# Patient Record
Sex: Female | Born: 1975
Health system: Southern US, Community
[De-identification: ages and names within clinical notes are randomized; demographics above are authoritative.]

## PROBLEM LIST (undated history)

## (undated) DIAGNOSIS — I4719 Other supraventricular tachycardia: Secondary | ICD-10-CM

## (undated) DIAGNOSIS — F419 Anxiety disorder, unspecified: Secondary | ICD-10-CM

## (undated) DIAGNOSIS — I471 Supraventricular tachycardia: Secondary | ICD-10-CM

## (undated) DIAGNOSIS — R87629 Unspecified abnormal cytological findings in specimens from vagina: Secondary | ICD-10-CM

## (undated) DIAGNOSIS — I10 Essential (primary) hypertension: Secondary | ICD-10-CM

## (undated) HISTORY — DX: Anxiety disorder, unspecified: F41.9

## (undated) HISTORY — DX: Supraventricular tachycardia: I47.1

## (undated) HISTORY — PX: ABLATION: SHX5711

## (undated) HISTORY — PX: LEEP: SHX91

## (undated) HISTORY — DX: Unspecified abnormal cytological findings in specimens from vagina: R87.629

## (undated) HISTORY — DX: Other supraventricular tachycardia: I47.19

---

## 2009-03-30 LAB — HEPATITIS B SURFACE ANTIGEN: Hepatitis B Surface Antigen: NEGATIVE

## 2009-03-30 LAB — HEPATITIS C ANTIBODY: Hepatitis C Ab: NEGATIVE

## 2009-03-30 LAB — HEPATITIS B SURFACE AB-POST VC IMM ST: Hepatitis B Surf Ab Quant: 11.5

## 2009-03-30 LAB — SYPHILIS: RPR W/REFLEX TO RPR TITER AND TREPONEMAL ANTIBODIES, TRADITIONAL SCREENING AND DIAGNOSIS ALGORITHM: RPR Ser-Titr: NONREACTIVE

## 2013-01-15 LAB — VARICELLA ZOSTER ANTIBODY, IGG: Varicella zoster IgG: 1059

## 2013-07-21 ENCOUNTER — Ambulatory Visit (INDEPENDENT_AMBULATORY_CARE_PROVIDER_SITE_OTHER): Payer: 59 | Admitting: Physician Assistant

## 2013-07-21 ENCOUNTER — Encounter: Payer: Self-pay | Admitting: Physician Assistant

## 2013-07-21 VITALS — BP 140/77 | HR 113 | Ht 69.0 in | Wt 218.0 lb

## 2013-07-21 DIAGNOSIS — E6609 Other obesity due to excess calories: Secondary | ICD-10-CM | POA: Insufficient documentation

## 2013-07-21 DIAGNOSIS — F411 Generalized anxiety disorder: Secondary | ICD-10-CM | POA: Insufficient documentation

## 2013-07-21 DIAGNOSIS — F329 Major depressive disorder, single episode, unspecified: Secondary | ICD-10-CM | POA: Insufficient documentation

## 2013-07-21 DIAGNOSIS — Z0000 Encounter for general adult medical examination without abnormal findings: Secondary | ICD-10-CM

## 2013-07-21 DIAGNOSIS — I498 Other specified cardiac arrhythmias: Secondary | ICD-10-CM

## 2013-07-21 DIAGNOSIS — R635 Abnormal weight gain: Secondary | ICD-10-CM | POA: Insufficient documentation

## 2013-07-21 DIAGNOSIS — F3289 Other specified depressive episodes: Secondary | ICD-10-CM

## 2013-07-21 DIAGNOSIS — Z13220 Encounter for screening for lipoid disorders: Secondary | ICD-10-CM

## 2013-07-21 DIAGNOSIS — Z131 Encounter for screening for diabetes mellitus: Secondary | ICD-10-CM

## 2013-07-21 DIAGNOSIS — Z6831 Body mass index (BMI) 31.0-31.9, adult: Secondary | ICD-10-CM

## 2013-07-21 DIAGNOSIS — I471 Supraventricular tachycardia: Secondary | ICD-10-CM | POA: Insufficient documentation

## 2013-07-21 DIAGNOSIS — F32A Depression, unspecified: Secondary | ICD-10-CM | POA: Insufficient documentation

## 2013-07-21 DIAGNOSIS — E669 Obesity, unspecified: Secondary | ICD-10-CM

## 2013-07-21 MED ORDER — ALPRAZOLAM 0.25 MG PO TABS: 0.2500 mg | ORAL_TABLET | ORAL | Status: DC | PRN

## 2013-07-21 MED ORDER — ALPRAZOLAM 0.5 MG PO TABS: ORAL_TABLET | ORAL | Status: DC

## 2013-07-21 MED ORDER — LORCASERIN HCL 10 MG PO TABS: 1.0000 | ORAL_TABLET | Freq: Two times a day (BID) | ORAL | Status: DC

## 2013-07-21 MED ORDER — METOPROLOL TARTRATE 50 MG PO TABS: 50.0000 mg | ORAL_TABLET | ORAL | Status: DC | PRN

## 2013-07-21 NOTE — Progress Notes (Signed)
Subjective:    Patient ID: Stephanie Rush, female    DOB: 1975-11-20, 38 y.o.   MRN: [id number]  HPI Patient is a 38 year old female who presents to the clinic to establish care.  .. Active Ambulatory Problems    Diagnosis Date Noted  . Depression 07/21/2013  . Anxiety state, unspecified 07/21/2013  . 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 Obesity, unspecified 07/21/2013  . Abnormal weight gain 07/21/2013   Resolved Ambulatory Problems    Diagnosis Date Noted  . No Resolved Ambulatory Problems   No Additional Past Medical History   .Marland Kitchen. Family History  Problem Relation Age of Onset  . Hypertension Father   . Hyperlipidemia Father   . Hypertension Brother   . Hyperlipidemia Brother   . Heart attack Maternal Grandmother   . Hyperlipidemia Maternal Grandmother   . Multiple sclerosis Maternal Grandmother   . Fibromyalgia Maternal Grandmother   . Alcohol abuse Paternal Grandmother   . Heart attack Paternal Grandfather   . Hypertension Paternal Grandfather   . Hyperlipidemia Paternal Grandfather   . Stroke Paternal Grandfather    .Marland Kitchen. History   Social History  . Marital Status: Married    Spouse Name: N/A    Number of Children: N/A  . Years of Education: N/A   Occupational History  . Not on file.   Social History Main Topics  . Smoking status: Former Games developermoker  . Smokeless tobacco: Not on file  . Alcohol Use: Yes  . Drug Use: No  . Sexual Activity: Yes   Other Topics Concern  . Not on file   Social History Narrative  . No narrative on file   Depression and anxiety-patient is currently controlled without any daily depression medication. About 2 months ago she weaned off Lexapro and feels great. She occasionally has to use Xanax for anxiety. She reports using this couple times a month. She would like a refill.  Reentry tachycardia-she has had 2 ablations. She still remains on metoprolol 50 mg daily. She does need a refill today. She has been released by  cardiology. [facility]  Obesity/weight gain-patient is really concerned about her weight. She has tried typical diet and exercising. She would like something to help her lose weight.     Review of Systems  All other systems reviewed and are negative.      Objective:   Physical Exam  Constitutional: She is oriented to person, place, and time. She appears well-developed and well-nourished.  Obese  HENT:  Head: Normocephalic and atraumatic.  Cardiovascular: Regular rhythm and normal heart sounds.   113 tachycardia  Pulmonary/Chest: Effort normal and breath sounds normal. She has no wheezes.  Neurological: She is alert and oriented to person, place, and time.  Skin: Skin is dry.  Psychiatric: She has a normal mood and affect. Her behavior is normal.          Assessment & Plan:  Depression/anxiety-followup as needed for maintenance. Xanax refill to use as needed. Discussed addictive nature of Xanax. And discourage use daily.  Reentry tachycardia-refilled metoprolol today. Patient is currently using as needed or palpitations and tachycardia. Her pulse rate today was 113. I really felt like she should start using daily. Suggested to patient daily use of metoprolol. I think this would help make her feel better and decrease episodes of palpation.  Obesity/abnormal weight gain-discuss options for medication intervention. She is not a candidate for phentermine to 2 her cardiac condition. I feel it pelvic  would be a great idea. I did give her the sample packs for 15 days to try. We can send prescription if she likes but weak and feels like it is helping her. Explained to patient this would not be a short-term weight loss medication but rather long-term and lifestyle change weight loss medication. Discussed other ways to cut calories and increase exercise. Patient is on board. Will followup in one month. We'll check thyroid, B12 and vitamin  D.  Screening labs for cholesterol and glucose were ordered today.  Patient is aware that she needs to schedule a complete physical

## 2013-07-21 NOTE — Patient Instructions (Signed)
Lorcaserin oral tablets  What is this medicine?  LORCASERIN (lor ca SER in) is used to promote and maintain weight loss in obese patients. This medicine should be used with a reduced calorie diet and, if appropriate, an exercise program.  This medicine may be used for other purposes; ask your health care Kamen Hanken or pharmacist if you have questions.  COMMON BRAND NAME(S): Belviq  What should I tell my health care Stephanie Rush before I take this medicine?  They need to know if you have any of these conditions:  -anatomical deformation of the penis, Peyronie's disease, or history of priapism (painful and prolonged erection)  -diabetes  -heart disease  -history of blood diseases, like sickle cell anemia or leukemia  -history of irregular heartbeat  -kidney disease  -liver disease  -suicidal thoughts, plans, or attempt; a previous suicide attempt by you or a family member  -an unusual or allergic reaction to lorcaserin, other medicines, foods, dyes, or preservatives  -pregnant or trying to get pregnant  -breast-feeding  How should I use this medicine?  Take this medicine by mouth with a glass of water. Follow the directions on the prescription label. You can take it with or without food. Take your medicine at regular intervals. Do not take it more often than directed. Do not stop taking except on your doctor's advice.  Talk to your pediatrician regarding the use of this medicine in children. Special care may be needed.  Overdosage: If you think you've taken too much of this medicine contact a poison control center or emergency room at once.  Overdosage: If you think you have taken too much of this medicine contact a poison control center or emergency room at once.  NOTE: This medicine is only for you. Do not share this medicine with others.  What if I miss a dose?  If you miss a dose, take it as soon as you can. If it is almost time for your next dose, take only that dose. Do not take double or extra doses.  What may  interact with this medicine?  -cabergoline  -certain medicines for depression, anxiety, or psychotic disturbances  -certain medicines for erectile dysfunction  -certain medicines for migraine headache like almotriptan, eletriptan, frovatriptan, naratriptan, rizatriptan, sumatriptan, zolmitriptan  -dextromethorphan  -linezolid  -MAOIs like Carbex, Eldepryl, Marplan, Nardil, and Parnate  -medicines for diabetes  -orlistat  -tramadol  -St. John's Wort  -stimulant medicines for attention disorders, weight loss, or to stay awake  This list may not describe all possible interactions. Give your health care Malorie Bigford a list of all the medicines, herbs, non-prescription drugs, or dietary supplements you use. Also tell them if you smoke, drink alcohol, or use illegal drugs. Some items may interact with your medicine.  What should I watch for while using this medicine?  This medicine is intended to be used in addition to a healthy diet and appropriate exercise. The best results are achieved this way. Do not increase or in any way change your dose without consulting your doctor or health care professional. Your doctor should tell you to stop taking this medicine if you do not lose a certain amount of weight within the first 12 weeks of treatment.  Visit your doctor or health care professional for regular checkups. Your doctor may order blood tests or other tests to see how you are doing.  Do not drive, use machinery, or do anything that needs mental alertness until you know how this medicine affects you.  This medicine   may affect blood sugar levels. If you have diabetes, check with your doctor or health care professional before you change your diet or the dose of your diabetic medicine.  Patients and their families should watch out for worsening depression or thoughts of suicide. Also watch out for sudden changes in feelings such as feeling anxious, agitated, panicky, irritable, hostile, aggressive, impulsive, severely restless,  overly excited and hyperactive, or not being able to sleep. If this happens, especially at the beginning of treatment or after a change in dose, call your health care professional.  Contact you doctor or health care professional right away if the erection lasts longer than 4 hours or if it becomes painful. This may be a sign of serious problem and must be treated right away to prevent permanent damage.  What side effects may I notice from receiving this medicine?  Side effects that you should report to your doctor or health care professional as soon as possible:  -allergic reactions like skin rash, itching or hives, swelling of the face, lips, or tongue  -abnormal production of milk  -breast enlargement in both males and females  -breathing problems  -changes in emotions or moods  -changes in vision  -confusion  -erection lasting more than 4 hours  -fast or irregular heart beat  -feeling faint or lightheaded, falls  -fever or chills, sore throat  -hallucination, loss of contact with reality  -high or low blood pressure  -menstrual changes  -restlessness  -seizures  -slow or irregular heartbeat  -stiff muscles  -sweating  -suicidal thoughts or other mood changes  -tremors  -trouble walking  -unusually weak or tired  -vomiting   Side effects that usually do not require medical attention (Report these to your doctor or health care professional if they continue or are bothersome.):  -back pain  -constipation  -cough  -dizziness  -dry mouth  -low blood sugar if you have diabetes (ask your doctor or healthcare professional for a list of these symptoms)  -nausea  -tiredness  This list may not describe all possible side effects. Call your doctor for medical advice about side effects. You may report side effects to FDA at 1-800-FDA-1088.  Where should I keep my medicine?  Keep out of the reach of children.  Store at room temperature between 15 and 30 degrees C (59 and 86 degrees F). Throw away any unused medicine after the  expiration date.  NOTE: This sheet is a summary. It may not cover all possible information. If you have questions about this medicine, talk to your doctor, pharmacist, or health care Amaziah Raisanen.  © 2014, [employer]. (2010-08-07 17:15:17)

## 2013-07-29 ENCOUNTER — Encounter: Payer: Self-pay | Admitting: Physician Assistant

## 2013-08-04 LAB — COMPLETE METABOLIC PANEL WITH GFR
ALBUMIN: 4.7 g/dL (ref 3.5–5.2)
ALT: 23 U/L (ref 0–35)
AST: 17 U/L (ref 0–37)
Alkaline Phosphatase: 68 U/L (ref 39–117)
BUN: 15 mg/dL (ref 6–23)
CALCIUM: 9 mg/dL (ref 8.4–10.5)
CHLORIDE: 105 meq/L (ref 96–112)
CO2: 24 meq/L (ref 19–32)
CREATININE: 0.68 mg/dL (ref 0.50–1.10)
GFR, Est African American: >89 mL/min
GFR, Est Non African American: >89 mL/min
GLUCOSE: 94 mg/dL (ref 70–99)
POTASSIUM: 4 meq/L (ref 3.5–5.3)
Sodium: 135 mEq/L (ref 135–145)
Total Bilirubin: 0.5 mg/dL (ref 0.2–1.2)
Total Protein: 7 g/dL (ref 6.0–8.3)

## 2013-08-04 LAB — TSH: TSH: 2.22 u[IU]/mL (ref 0.350–4.500)

## 2013-08-04 LAB — LIPID PANEL
Cholesterol: 195 mg/dL (ref 0–200)
HDL: 47 mg/dL (ref >39)
LDL Cholesterol: 133 mg/dL — ABNORMAL HIGH (ref 0–99)
TRIGLYCERIDES: 75 mg/dL (ref <150)
Total CHOL/HDL Ratio: 4.1 Ratio
VLDL: 15 mg/dL (ref 0–40)

## 2013-08-04 LAB — VITAMIN D 25 HYDROXY (VIT D DEFICIENCY, FRACTURES): Vit D, 25-Hydroxy: 43 ng/mL (ref 30–89)

## 2013-08-04 LAB — VITAMIN B12: Vitamin B-12: 432 pg/mL (ref 211–911)

## 2013-08-05 NOTE — Progress Notes (Signed)
Left voice mail for patient. Corliss SkainsJamie Genavieve Mangiapane, CMA

## 2013-08-19 ENCOUNTER — Encounter: Payer: 59 | Admitting: Physician Assistant

## 2013-09-22 ENCOUNTER — Encounter: Payer: Self-pay | Admitting: Physician Assistant

## 2013-09-22 ENCOUNTER — Ambulatory Visit (INDEPENDENT_AMBULATORY_CARE_PROVIDER_SITE_OTHER): Payer: 59 | Admitting: Physician Assistant

## 2013-09-22 ENCOUNTER — Other Ambulatory Visit (HOSPITAL_COMMUNITY)
Admission: RE | Admit: 2013-09-22 | Discharge: 2013-09-22 | Disposition: A | Discharge status: Home / Self Care | Payer: 59 | Source: Ambulatory Visit | Attending: Family Medicine | Admitting: Family Medicine

## 2013-09-22 VITALS — BP 138/73 | HR 71 | Ht 69.75 in | Wt 212.0 lb

## 2013-09-22 DIAGNOSIS — Z01419 Encounter for gynecological examination (general) (routine) without abnormal findings: Secondary | ICD-10-CM | POA: Insufficient documentation

## 2013-09-22 DIAGNOSIS — Z0000 Encounter for general adult medical examination without abnormal findings: Secondary | ICD-10-CM

## 2013-09-22 DIAGNOSIS — R635 Abnormal weight gain: Secondary | ICD-10-CM

## 2013-09-22 DIAGNOSIS — I10 Essential (primary) hypertension: Secondary | ICD-10-CM

## 2013-09-22 DIAGNOSIS — I498 Other specified cardiac arrhythmias: Secondary | ICD-10-CM

## 2013-09-22 DIAGNOSIS — Z1151 Encounter for screening for human papillomavirus (HPV): Secondary | ICD-10-CM | POA: Diagnosis present

## 2013-09-22 DIAGNOSIS — I471 Supraventricular tachycardia: Secondary | ICD-10-CM

## 2013-09-22 DIAGNOSIS — Z124 Encounter for screening for malignant neoplasm of cervix: Secondary | ICD-10-CM

## 2013-09-22 MED ORDER — LORCASERIN HCL 10 MG PO TABS: 1.0000 | ORAL_TABLET | Freq: Two times a day (BID) | ORAL | Status: DC

## 2013-09-22 MED ORDER — METOPROLOL SUCCINATE ER 50 MG PO TB24: 50.0000 mg | ORAL_TABLET | Freq: Every day | ORAL | Status: DC

## 2013-09-22 NOTE — Patient Instructions (Signed)
Keeping You Healthy  Get These Tests 1. Blood Pressure- Have your blood pressure checked once a year by your health care provider.  Normal blood pressure is 120/80. 2. Weight- Have your body mass index (BMI) calculated to screen for obesity.  BMI is measure of body fat based on height and weight.  You can also calculate your own BMI at www.nhlbisupport.com/bmi/. 3. Cholesterol- Have your cholesterol checked every 5 years starting at age 38 then yearly starting at age 45. 4. Chlamydia, HIV, and other sexually transmitted diseases- Get screened every year until age 25, then within three months of each new sexual provider. 5. Pap Smear- Every 1-3 years; discuss with your health care provider. 6. Mammogram- Every year starting at age 40  Take these medicines  Calcium with Vitamin D-Your body needs 1200 mg of Calcium each day and 800-1000 IU of Vitamin D daily.  Your body can only absorb 500 mg of Calcium at a time so Calcium must be taken in 2 or 3 divided doses throughout the day.  Multivitamin with folic acid- Once daily if it is possible for you to become pregnant.  Get these Immunizations  Tetanus shot- Every 10 years.  Flu shot-Every year.  Take these steps 1. Do not smoke-Your healthcare provider can help you quit.  For tips on how to quit go to www.smokefree.gov or call 1-800 QUITNOW. 2. Be physically active- Exercise 5 days a week for at least 30 minutes.  If you are not already physically active, start slow and gradually work up to 30 minutes of moderate physical activity.  Examples of moderate activity include walking briskly, dancing, swimming, bicycling, etc. 3. Breast Cancer- A self breast exam every month is important for early detection of breast cancer.  For more information and instruction on self breast exams, ask your healthcare provider or www.womenshealth.gov/faq/breast-self-exam.cfm. 4. Eat a healthy diet- Eat a variety of healthy foods such as fruits, vegetables, whole  grains, low fat milk, low fat cheeses, yogurt, lean meats, poultry and fish, beans, nuts, tofu, etc.  For more information go to www. Thenutritionsource.org 5. Drink alcohol in moderation- Limit alcohol intake to one drink or less per day. Never drink and drive. 6. Depression- Your emotional health is as important as your physical health.  If you're feeling down or losing interest in things you normally enjoy please talk to your healthcare provider about being screened for depression. 7. Dental visit- Brush and floss your teeth twice daily; visit your dentist twice a year. 8. Eye doctor- Get an eye exam at least every 2 years. 9. Helmet use- Always wear a helmet when riding a bicycle, motorcycle, rollerblading or skateboarding. 10. Safe sex- If you may be exposed to sexually transmitted infections, use a condom. 11. Seat belts- Seat belts can save your live; always wear one. 12. Smoke/Carbon Monoxide detectors- These detectors need to be installed on the appropriate level of your home. Replace batteries at least once a year. 13. Skin cancer- When out in the sun please cover up and use sunscreen 15 SPF or higher. 14. Violence- If anyone is threatening or hurting you, please tell your healthcare provider.        

## 2013-09-24 LAB — CYTOLOGY - PAP

## 2013-09-25 DIAGNOSIS — I10 Essential (primary) hypertension: Secondary | ICD-10-CM | POA: Insufficient documentation

## 2013-09-25 NOTE — Progress Notes (Signed)
  Subjective:     Stephanie Rush is a 38 y.o. female and is here for a comprehensive physical exam. The patient reports problems - pt needs refill of belviq to start back. she did trial and started to notice some benefit. Marland Kitchen.  History   Social History  . Marital Status: Married    Spouse Name: N/A    Number of Children: N/A  . Years of Education: N/A   Occupational History  . Not on file.   Social History Main Topics  . Smoking status: Former Games developermoker  . Smokeless tobacco: Not on file  . Alcohol Use: Yes  . Drug Use: No  . Sexual Activity: Yes   Other Topics Concern  . Not on file   Social History Narrative  . No narrative on file   Health Maintenance  Topic Date Due  . Influenza Vaccine  12/23/2013 (Originally 09/04/2013)  . Pap Smear  09/22/2016  . Tetanus/tdap  06/14/2019    The following portions of the patient's history were reviewed and updated as appropriate: allergies, current medications, past family history, past medical history, past social history, past surgical history and problem list.  Review of Systems A comprehensive review of systems was negative.   Objective:    BP 138/73  Pulse 71  Ht 5' 9.75" (1.772 m)  Wt 212 lb (96.163 kg)  BMI 30.63 kg/m2  SpO2 98% General appearance: alert, cooperative and appears stated age Head: Normocephalic, without obvious abnormality, atraumatic Eyes: conjunctivae/corneas clear. PERRL, EOM's intact. Fundi benign. Ears: normal TM's and external ear canals both ears Nose: Nares normal. Septum midline. Mucosa normal. No drainage or sinus tenderness. Throat: lips, mucosa, and tongue normal; teeth and gums normal Neck: no adenopathy, no carotid bruit, no JVD, supple, symmetrical, trachea midline and thyroid not enlarged, symmetric, no tenderness/mass/nodules Back: symmetric, no curvature. ROM normal. No CVA tenderness. Lungs: clear to auscultation bilaterally Breasts: normal appearance, no masses or tenderness Heart:  regular rate and rhythm, S1, S2 normal, no murmur, click, rub or gallop Abdomen: soft, non-tender; bowel sounds normal; no masses,  no organomegaly Pelvic: cervix normal in appearance, external genitalia normal, no adnexal masses or tenderness, no cervical motion tenderness, uterus normal size, shape, and consistency and vagina normal without discharge Extremities: extremities normal, atraumatic, no cyanosis or edema Pulses: 2+ and symmetric Skin: Skin color, texture, turgor normal. No rashes or lesions Lymph nodes: Cervical, supraclavicular, and axillary nodes normal. Neurologic: Grossly normal    Assessment:    Healthy female exam.      Plan:    CPE- pap done today. Vaccines up to date. Pt declined flu shot today. Will get at work. Labs reviewed. Encouraged calcium 1200mg  and vitamin D 800 units daily.   Abnormal weight gain- refilled belviq. Discussed only as good as diet and exercise together. At least 150 minutes of exercise a week encouraged. Limiting calories to 1500 is also benefical.   HTN- BP has remained borderline. Previously only taken as needed for tachycardia. Encouraged her to take metoprolol daily. Denies any symptoms.    See After Visit Summary for Counseling Recommendations

## 2014-03-02 ENCOUNTER — Encounter: Payer: Self-pay | Admitting: Physician Assistant

## 2014-07-03 ENCOUNTER — Telehealth: Payer: Self-pay | Admitting: Family

## 2014-07-03 DIAGNOSIS — M545 Low back pain, unspecified: Secondary | ICD-10-CM

## 2014-07-03 MED ORDER — ETODOLAC 300 MG PO CAPS: 300.0000 mg | ORAL_CAPSULE | Freq: Two times a day (BID) | ORAL | Status: DC

## 2014-07-03 MED ORDER — CYCLOBENZAPRINE HCL 10 MG PO TABS: 10.0000 mg | ORAL_TABLET | Freq: Three times a day (TID) | ORAL | Status: DC | PRN

## 2014-07-03 NOTE — Progress Notes (Signed)
We are sorry that you are not feeling well.  Here is how we plan to help!  Based on what you have shared with me it looks like you mostly have acute back pain.  Acute back pain is defined as musculoskeletal pain that can resolve in 1-3 weeks with conservative treatment.  I have prescribed Etodolac 300 mg twice a day non-steroid anti-inflammatory (NSAID) as well as Flexeril 10 mg every eight hours as needed which is a muscle relaxer.  Some patients experience stomach irritation or in increased heartburn with anti-inflammatory drugs.  Please keep in mind that muscle relaxer's can cause fatigue and should not be taken while at work or driving.  Back pain is very common.  The pain often gets better over time.  The cause of back pain is usually not dangerous.  Most people can learn to manage their back pain on their own.  Home Care  Stay active.  Start with short walks on flat ground if you can.  Try to walk farther each day.  Do not sit, drive or stand in one place for more than 30 minutes.  Do not stay in bed.  Do not avoid exercise or work.  Activity can help your back heal faster.  Be careful when you bend or lift an object.  Bend at your knees, keep the object close to you, and do not twist.  Sleep on a firm mattress.  Lie on your side, and bend your knees.  If you lie on your back, put a pillow under your knees.  Only take medicines as told by your doctor.  Put ice on the injured area.  Put ice in a plastic bag  Place a towel between your skin and the bag  Leave the ice on for 15-20 minutes, 3-4 times a day for the first 2-3 days.  After that, you can switch between ice and heat packs.  Ask your doctor about back exercises or massage.  Avoid feeling anxious or stressed.  Find good ways to deal with stress, such as exercise.  Get Help Right Way If:  Your pain does not go away with rest or medicine.  Your pain does not go away in 1 week.  You have new problems.  You do not  feel well.  The pain spreads into your legs.  You cannot control when you poop (bowel movement) or pee (urinate)  You feel sick to your stomach (nauseous) or throw up (vomit)  You have belly (abdominal) pain.  You feel like you may pass out (faint).  If you develop a fever.  Make Sure you:  Understand these instructions.  Will watch your condition  Will get help right away if you are not doing well or get worse.  Your e-visit answers were reviewed by a board certified advanced clinical practitioner to complete your personal care plan.  Depending on the condition, your plan could have included both over the counter or prescription medications.  If there is a problem please reply  once you have received a response from your provider.  Your safety is important to us.  If you have drug allergies check your prescription carefully.    You can use MyChart to ask questions about today's visit, request a non-urgent call back, or ask for a work or school excuse.  You will get an e-mail in the next two days asking about your experience.  I hope that your e-visit has been valuable and will speed your recovery. Thank you   for using e-visits.   

## 2014-12-15 ENCOUNTER — Telehealth: Payer: 59 | Admitting: Physician Assistant

## 2014-12-15 DIAGNOSIS — B9689 Other specified bacterial agents as the cause of diseases classified elsewhere: Secondary | ICD-10-CM

## 2014-12-15 DIAGNOSIS — J019 Acute sinusitis, unspecified: Secondary | ICD-10-CM

## 2014-12-16 MED ORDER — AMOXICILLIN-POT CLAVULANATE 875-125 MG PO TABS: 1.0000 | ORAL_TABLET | Freq: Two times a day (BID) | ORAL | Status: DC

## 2014-12-16 NOTE — Progress Notes (Signed)

## 2014-12-22 ENCOUNTER — Encounter: Payer: Self-pay | Admitting: Physician Assistant

## 2014-12-22 ENCOUNTER — Telehealth: Payer: Self-pay | Admitting: Emergency Medicine

## 2014-12-22 NOTE — Telephone Encounter (Signed)
Patient wants to have labs for her scheduled annual with Jade on 01-05-15 faxed to her so she can have drawn [facility]; in addition to the usual, [facility]; does want pap this visit; please ignore her email today since she now knows she must have IUD replaced by GYN and will make those arrangements. Her FAX: [phone]. pak

## 2014-12-24 ENCOUNTER — Emergency Department (HOSPITAL_COMMUNITY): Payer: 59 | Admitting: Anesthesiology

## 2014-12-24 ENCOUNTER — Observation Stay (HOSPITAL_COMMUNITY)
Admission: EM | Admit: 2014-12-24 | Discharge: 2014-12-25 | Disposition: A | Discharge status: Home / Self Care | Payer: 59 | Attending: General Surgery | Admitting: General Surgery

## 2014-12-24 ENCOUNTER — Encounter (HOSPITAL_COMMUNITY)
Admission: EM | Disposition: A | Discharge status: Home / Self Care | Payer: Self-pay | Source: Home / Self Care | Attending: Emergency Medicine

## 2014-12-24 ENCOUNTER — Encounter (HOSPITAL_COMMUNITY): Payer: Self-pay | Admitting: Emergency Medicine

## 2014-12-24 ENCOUNTER — Emergency Department (HOSPITAL_COMMUNITY): Payer: 59

## 2014-12-24 DIAGNOSIS — Z79899 Other long term (current) drug therapy: Secondary | ICD-10-CM | POA: Insufficient documentation

## 2014-12-24 DIAGNOSIS — R1031 Right lower quadrant pain: Secondary | ICD-10-CM | POA: Diagnosis present

## 2014-12-24 DIAGNOSIS — K37 Unspecified appendicitis: Secondary | ICD-10-CM | POA: Diagnosis present

## 2014-12-24 DIAGNOSIS — Z87891 Personal history of nicotine dependence: Secondary | ICD-10-CM | POA: Insufficient documentation

## 2014-12-24 DIAGNOSIS — I10 Essential (primary) hypertension: Secondary | ICD-10-CM | POA: Diagnosis not present

## 2014-12-24 DIAGNOSIS — K3580 Unspecified acute appendicitis: Secondary | ICD-10-CM | POA: Diagnosis not present

## 2014-12-24 DIAGNOSIS — I471 Supraventricular tachycardia: Secondary | ICD-10-CM | POA: Insufficient documentation

## 2014-12-24 DIAGNOSIS — F419 Anxiety disorder, unspecified: Secondary | ICD-10-CM | POA: Insufficient documentation

## 2014-12-24 DIAGNOSIS — R110 Nausea: Secondary | ICD-10-CM

## 2014-12-24 HISTORY — PX: LAPAROSCOPIC APPENDECTOMY: SHX408

## 2014-12-24 LAB — URINE MICROSCOPIC-ADD ON
BACTERIA UA: NONE SEEN
RBC / HPF: NONE SEEN RBC/hpf (ref 0–5)

## 2014-12-24 LAB — CBC
HEMATOCRIT: 40 % (ref 36.0–46.0)
HEMOGLOBIN: 14.1 g/dL (ref 12.0–15.0)
MCH: 30.9 pg (ref 26.0–34.0)
MCHC: 35.3 g/dL (ref 30.0–36.0)
MCV: 87.5 fL (ref 78.0–100.0)
PLATELETS: 221 10*3/uL (ref 150–400)
RBC: 4.57 MIL/uL (ref 3.87–5.11)
RDW: 12.4 % (ref 11.5–15.5)
WBC: 8.4 10*3/uL (ref 4.0–10.5)

## 2014-12-24 LAB — URINALYSIS, ROUTINE W REFLEX MICROSCOPIC
BILIRUBIN URINE: NEGATIVE
GLUCOSE, UA: NEGATIVE mg/dL
Hgb urine dipstick: NEGATIVE
KETONES UR: NEGATIVE mg/dL
NITRITE: NEGATIVE
PH: 7 (ref 5.0–8.0)
Protein, ur: NEGATIVE mg/dL
Specific Gravity, Urine: 1.004 — ABNORMAL LOW (ref 1.005–1.030)

## 2014-12-24 LAB — COMPREHENSIVE METABOLIC PANEL
ALK PHOS: 77 U/L (ref 38–126)
ALT: 21 U/L (ref 14–54)
ANION GAP: 9 (ref 5–15)
AST: 16 U/L (ref 15–41)
Albumin: 4.5 g/dL (ref 3.5–5.0)
BILIRUBIN TOTAL: 1.2 mg/dL (ref 0.3–1.2)
BUN: 10 mg/dL (ref 6–20)
CALCIUM: 9.4 mg/dL (ref 8.9–10.3)
CO2: 24 mmol/L (ref 22–32)
Chloride: 106 mmol/L (ref 101–111)
Creatinine, Ser: 0.71 mg/dL (ref 0.44–1.00)
Glucose, Bld: 136 mg/dL — ABNORMAL HIGH (ref 65–99)
POTASSIUM: 3.7 mmol/L (ref 3.5–5.1)
Sodium: 139 mmol/L (ref 135–145)
TOTAL PROTEIN: 7.4 g/dL (ref 6.5–8.1)

## 2014-12-24 LAB — LIPASE, BLOOD: Lipase: 26 U/L (ref 11–51)

## 2014-12-24 LAB — I-STAT BETA HCG BLOOD, ED (MC, WL, AP ONLY): I-stat hCG, quantitative: <5 m[IU]/mL (ref <5)

## 2014-12-24 SURGERY — APPENDECTOMY, LAPAROSCOPIC
Anesthesia: General

## 2014-12-24 MED ORDER — ACETAMINOPHEN 650 MG RE SUPP
650.0000 mg | Freq: Four times a day (QID) | RECTAL | Status: DC | PRN
Start: 2014-12-24 — End: 2014-12-25

## 2014-12-24 MED ORDER — BUPIVACAINE-EPINEPHRINE 0.25% -1:200000 IJ SOLN
INTRAMUSCULAR | Status: AC
  Filled 2014-12-24: qty 1

## 2014-12-24 MED ORDER — IOHEXOL 300 MG/ML  SOLN
25.0000 mL | Freq: Once | INTRAMUSCULAR | Status: AC | PRN
  Administered 2014-12-24: 25 mL via ORAL

## 2014-12-24 MED ORDER — CEFTRIAXONE SODIUM 2 G IJ SOLR
2.0000 g | Freq: Once | INTRAMUSCULAR | Status: AC
  Administered 2014-12-24: 2 g via INTRAVENOUS
  Filled 2014-12-24: qty 2

## 2014-12-24 MED ORDER — ONDANSETRON HCL 4 MG/2ML IJ SOLN
4.0000 mg | Freq: Once | INTRAMUSCULAR | Status: DC | PRN
  Administered 2014-12-24: 4 mg via INTRAVENOUS
  Filled 2014-12-24: qty 2

## 2014-12-24 MED ORDER — ROCURONIUM BROMIDE 100 MG/10ML IV SOLN
INTRAVENOUS | Status: DC | PRN
  Administered 2014-12-24: 20 mg via INTRAVENOUS

## 2014-12-24 MED ORDER — SUCCINYLCHOLINE CHLORIDE 20 MG/ML IJ SOLN
INTRAMUSCULAR | Status: DC | PRN
  Administered 2014-12-24: 100 mg via INTRAVENOUS

## 2014-12-24 MED ORDER — LIDOCAINE HCL (CARDIAC) 20 MG/ML IV SOLN
INTRAVENOUS | Status: DC | PRN
  Administered 2014-12-24: 100 mg via INTRAVENOUS

## 2014-12-24 MED ORDER — MORPHINE SULFATE (PF) 4 MG/ML IV SOLN
4.0000 mg | Freq: Once | INTRAVENOUS | Status: AC
  Administered 2014-12-24: 4 mg via INTRAVENOUS
  Filled 2014-12-24: qty 1

## 2014-12-24 MED ORDER — SUGAMMADEX SODIUM 200 MG/2ML IV SOLN
INTRAVENOUS | Status: DC | PRN
  Administered 2014-12-24: 200 mg via INTRAVENOUS

## 2014-12-24 MED ORDER — LORAZEPAM 2 MG/ML IJ SOLN
0.5000 mg | Freq: Four times a day (QID) | INTRAMUSCULAR | Status: DC | PRN
  Administered 2014-12-25: 0.5 mg via INTRAVENOUS
  Filled 2014-12-24: qty 1

## 2014-12-24 MED ORDER — PROPOFOL 10 MG/ML IV BOLUS
INTRAVENOUS | Status: AC
  Filled 2014-12-24: qty 20

## 2014-12-24 MED ORDER — DIPHENHYDRAMINE HCL 12.5 MG/5ML PO ELIX
12.5000 mg | ORAL_SOLUTION | Freq: Four times a day (QID) | ORAL | Status: DC | PRN
Start: 2014-12-24 — End: 2014-12-25
  Administered 2014-12-25: 12.5 mg via ORAL
  Filled 2014-12-24: qty 5

## 2014-12-24 MED ORDER — PROPOFOL 10 MG/ML IV BOLUS
INTRAVENOUS | Status: DC | PRN
  Administered 2014-12-24: 160 mg via INTRAVENOUS

## 2014-12-24 MED ORDER — LACTATED RINGERS IR SOLN
Status: DC | PRN
  Administered 2014-12-24: 1000 mL

## 2014-12-24 MED ORDER — SODIUM CHLORIDE 0.9 % IV SOLN
1000.0000 mL | Freq: Once | INTRAVENOUS | Status: AC
  Administered 2014-12-24: 1000 mL via INTRAVENOUS

## 2014-12-24 MED ORDER — ONDANSETRON HCL 4 MG/2ML IJ SOLN
INTRAMUSCULAR | Status: AC
  Filled 2014-12-24: qty 2

## 2014-12-24 MED ORDER — DIPHENHYDRAMINE HCL 50 MG/ML IJ SOLN: 12.5000 mg | Freq: Four times a day (QID) | INTRAMUSCULAR | Status: DC | PRN

## 2014-12-24 MED ORDER — MORPHINE SULFATE (PF) 2 MG/ML IV SOLN
1.0000 mg | INTRAVENOUS | Status: DC | PRN
  Administered 2014-12-25: 2 mg via INTRAVENOUS
  Filled 2014-12-24: qty 1

## 2014-12-24 MED ORDER — IOHEXOL 300 MG/ML  SOLN
100.0000 mL | Freq: Once | INTRAMUSCULAR | Status: AC | PRN
  Administered 2014-12-24: 100 mL via INTRAVENOUS

## 2014-12-24 MED ORDER — ONDANSETRON HCL 4 MG/2ML IJ SOLN: 4.0000 mg | Freq: Once | INTRAMUSCULAR | Status: DC | PRN

## 2014-12-24 MED ORDER — CISATRACURIUM BESYLATE 20 MG/10ML IV SOLN
INTRAVENOUS | Status: AC
  Filled 2014-12-24: qty 10

## 2014-12-24 MED ORDER — METOPROLOL TARTRATE 1 MG/ML IV SOLN: 5.0000 mg | Freq: Four times a day (QID) | INTRAVENOUS | Status: DC | PRN

## 2014-12-24 MED ORDER — LIDOCAINE HCL (CARDIAC) 20 MG/ML IV SOLN
INTRAVENOUS | Status: AC
  Filled 2014-12-24: qty 5

## 2014-12-24 MED ORDER — PANTOPRAZOLE SODIUM 40 MG IV SOLR
40.0000 mg | Freq: Every day | INTRAVENOUS | Status: DC
  Administered 2014-12-24: 40 mg via INTRAVENOUS
  Filled 2014-12-24: qty 40

## 2014-12-24 MED ORDER — FENTANYL CITRATE (PF) 100 MCG/2ML IJ SOLN
INTRAMUSCULAR | Status: DC | PRN
  Administered 2014-12-24: 50 ug via INTRAVENOUS
  Administered 2014-12-24 (×2): 100 ug via INTRAVENOUS

## 2014-12-24 MED ORDER — SODIUM CHLORIDE 0.9 % IV SOLN
1000.0000 mL | INTRAVENOUS | Status: DC
  Administered 2014-12-24: 20:00:00 via INTRAVENOUS

## 2014-12-24 MED ORDER — METOPROLOL SUCCINATE ER 50 MG PO TB24
50.0000 mg | ORAL_TABLET | Freq: Every day | ORAL | Status: DC
  Administered 2014-12-25: 50 mg via ORAL
  Filled 2014-12-24: qty 1

## 2014-12-24 MED ORDER — DEXAMETHASONE SODIUM PHOSPHATE 10 MG/ML IJ SOLN
INTRAMUSCULAR | Status: AC
  Filled 2014-12-24: qty 2

## 2014-12-24 MED ORDER — KETOROLAC TROMETHAMINE 30 MG/ML IJ SOLN
INTRAMUSCULAR | Status: DC | PRN
  Administered 2014-12-24: 30 mg via INTRAVENOUS

## 2014-12-24 MED ORDER — ONDANSETRON HCL 4 MG/2ML IJ SOLN: 4.0000 mg | Freq: Four times a day (QID) | INTRAMUSCULAR | Status: DC | PRN

## 2014-12-24 MED ORDER — PROMETHAZINE HCL 25 MG/ML IJ SOLN: 12.5000 mg | Freq: Four times a day (QID) | INTRAMUSCULAR | Status: DC | PRN

## 2014-12-24 MED ORDER — ONDANSETRON 4 MG PO TBDP: 4.0000 mg | ORAL_TABLET | Freq: Four times a day (QID) | ORAL | Status: DC | PRN

## 2014-12-24 MED ORDER — ACETAMINOPHEN 325 MG PO TABS: 650.0000 mg | ORAL_TABLET | Freq: Four times a day (QID) | ORAL | Status: DC | PRN

## 2014-12-24 MED ORDER — FENTANYL CITRATE (PF) 250 MCG/5ML IJ SOLN
INTRAMUSCULAR | Status: AC
  Filled 2014-12-24: qty 5

## 2014-12-24 MED ORDER — HEPARIN SODIUM (PORCINE) 5000 UNIT/ML IJ SOLN
5000.0000 [IU] | Freq: Three times a day (TID) | INTRAMUSCULAR | Status: DC
  Administered 2014-12-25: 5000 [IU] via SUBCUTANEOUS
  Filled 2014-12-24 (×2): qty 1

## 2014-12-24 MED ORDER — LACTATED RINGERS IV SOLN
INTRAVENOUS | Status: DC | PRN
  Administered 2014-12-24: 21:00:00 via INTRAVENOUS

## 2014-12-24 MED ORDER — 0.9 % SODIUM CHLORIDE (POUR BTL) OPTIME
TOPICAL | Status: DC | PRN
  Administered 2014-12-24: 1000 mL

## 2014-12-24 MED ORDER — HYDROMORPHONE HCL 1 MG/ML IJ SOLN
0.2500 mg | INTRAMUSCULAR | Status: DC | PRN
  Administered 2014-12-24 (×2): 0.5 mg via INTRAVENOUS

## 2014-12-24 MED ORDER — KETOROLAC TROMETHAMINE 15 MG/ML IJ SOLN: 15.0000 mg | Freq: Four times a day (QID) | INTRAMUSCULAR | Status: DC | PRN

## 2014-12-24 MED ORDER — BUPIVACAINE-EPINEPHRINE 0.25% -1:200000 IJ SOLN
INTRAMUSCULAR | Status: DC | PRN
  Administered 2014-12-24: 50 mL

## 2014-12-24 MED ORDER — OXYCODONE HCL 5 MG PO TABS
5.0000 mg | ORAL_TABLET | ORAL | Status: DC | PRN
  Administered 2014-12-25 (×2): 10 mg via ORAL
  Filled 2014-12-24 (×2): qty 2

## 2014-12-24 MED ORDER — ONDANSETRON HCL 4 MG/2ML IJ SOLN
4.0000 mg | Freq: Once | INTRAMUSCULAR | Status: AC
  Administered 2014-12-24: 4 mg via INTRAVENOUS
  Filled 2014-12-24: qty 2

## 2014-12-24 MED ORDER — KCL IN DEXTROSE-NACL 20-5-0.45 MEQ/L-%-% IV SOLN
INTRAVENOUS | Status: DC
  Administered 2014-12-24: 23:00:00 via INTRAVENOUS
  Filled 2014-12-24 (×3): qty 1000

## 2014-12-24 MED ORDER — HYDROMORPHONE HCL 1 MG/ML IJ SOLN
INTRAMUSCULAR | Status: AC
  Administered 2014-12-24: 0.5 mg via INTRAVENOUS
  Filled 2014-12-24: qty 1

## 2014-12-24 MED ORDER — DEXAMETHASONE SODIUM PHOSPHATE 10 MG/ML IJ SOLN
INTRAMUSCULAR | Status: DC | PRN
  Administered 2014-12-24: 10 mg via INTRAVENOUS

## 2014-12-24 MED ORDER — SUGAMMADEX SODIUM 200 MG/2ML IV SOLN
INTRAVENOUS | Status: AC
  Filled 2014-12-24: qty 2

## 2014-12-24 MED ORDER — METRONIDAZOLE IN NACL 5-0.79 MG/ML-% IV SOLN
500.0000 mg | Freq: Once | INTRAVENOUS | Status: AC
  Administered 2014-12-24: 500 mg via INTRAVENOUS
  Filled 2014-12-24: qty 100

## 2014-12-24 SURGICAL SUPPLY — 37 items
APPLIER CLIP 5 13 M/L LIGAMAX5 (MISCELLANEOUS)
APPLIER CLIP ROT 10 11.4 M/L (STAPLE)
CLIP APPLIE 5 13 M/L LIGAMAX5 (MISCELLANEOUS) IMPLANT
CLIP APPLIE ROT 10 11.4 M/L (STAPLE) IMPLANT
CLOSURE WOUND 1/2 X4 (GAUZE/BANDAGES/DRESSINGS)
COVER SURGICAL LIGHT HANDLE (MISCELLANEOUS) ×3 IMPLANT
CUTTER FLEX LINEAR 45M (STAPLE) ×3 IMPLANT
DECANTER SPIKE VIAL GLASS SM (MISCELLANEOUS) ×3 IMPLANT
DRAPE LAPAROSCOPIC ABDOMINAL (DRAPES) ×3 IMPLANT
DRAPE UTILITY XL STRL (DRAPES) ×3 IMPLANT
ELECT PENCIL ROCKER SW 15FT (MISCELLANEOUS) IMPLANT
ELECT REM PT RETURN 9FT ADLT (ELECTROSURGICAL) ×3
ELECTRODE REM PT RTRN 9FT ADLT (ELECTROSURGICAL) ×1 IMPLANT
GLOVE BIO SURGEON STRL SZ7.5 (GLOVE) ×3 IMPLANT
GLOVE BIOGEL M STRL SZ7.5 (GLOVE) IMPLANT
GLOVE INDICATOR 8.0 STRL GRN (GLOVE) ×3 IMPLANT
GOWN STRL REUS W/TWL LRG LVL3 (GOWN DISPOSABLE) ×3 IMPLANT
GOWN STRL REUS W/TWL XL LVL3 (GOWN DISPOSABLE) ×6 IMPLANT
IV LACTATED RINGERS 1000ML (IV SOLUTION) ×3 IMPLANT
KIT BASIN OR (CUSTOM PROCEDURE TRAY) ×3 IMPLANT
LIQUID BAND (GAUZE/BANDAGES/DRESSINGS) IMPLANT
POUCH SPECIMEN RETRIEVAL 10MM (ENDOMECHANICALS) ×3 IMPLANT
RELOAD 45 VASCULAR/THIN (ENDOMECHANICALS) ×3 IMPLANT
RELOAD STAPLE TA45 3.5 REG BLU (ENDOMECHANICALS) IMPLANT
SET IRRIG TUBING LAPAROSCOPIC (IRRIGATION / IRRIGATOR) ×3 IMPLANT
SET TUBE IRRIG SUCTION NO TIP (IRRIGATION / IRRIGATOR) ×3 IMPLANT
SHEARS HARMONIC ACE PLUS 36CM (ENDOMECHANICALS) ×3 IMPLANT
SOLUTION ANTI FOG 6CC (MISCELLANEOUS) ×3 IMPLANT
STRIP CLOSURE SKIN 1/2X4 (GAUZE/BANDAGES/DRESSINGS) IMPLANT
SUT MNCRL AB 4-0 PS2 18 (SUTURE) ×3 IMPLANT
TOWEL OR 17X26 10 PK STRL BLUE (TOWEL DISPOSABLE) ×3 IMPLANT
TRAY FOLEY W/METER SILVER 14FR (SET/KITS/TRAYS/PACK) ×3 IMPLANT
TRAY FOLEY W/METER SILVER 16FR (SET/KITS/TRAYS/PACK) IMPLANT
TRAY LAPAROSCOPIC (CUSTOM PROCEDURE TRAY) ×3 IMPLANT
TROCAR BLADELESS OPT 5 75 (ENDOMECHANICALS) ×6 IMPLANT
TROCAR XCEL BLUNT TIP 100MML (ENDOMECHANICALS) ×3 IMPLANT
TUBING INSUFFLATION 10FT LAP (TUBING) ×3 IMPLANT

## 2014-12-24 NOTE — ED Provider Notes (Signed)
CSN: [id number]     Arrival date & time 12/24/14  1428 History   First MD Initiated Contact with Patient 12/24/14 1638     Chief Complaint  Patient presents with  . Abdominal Pain     (Consider location/radiation/quality/duration/timing/severity/associated sxs/prior Treatment) HPI   Stephanie Rush is a 39 y.o. female, no pertinent past medical history, presenting to the ED with RLQ abdominal pain that began yesterday. Pain started generalized in the abdomen, but then moved to the RLQ this morning. Pt rates pain at 3/10 but goes up to 7/10 with walking or palpation, sharp in nature, and non-radiating. Pt also complains of nausea and bloating, but no vomiting. Pt has an IUD so she only has three menstrual periods a year and had one about a month ago. Last full BM was two days ago, but this is normal for her and states she does not feel constipated. Pt has had some little stool pass yesterday and has been able to pass gas.  Pt has tried pepto bismol yesterday, but this did not change the pain. Pt denies urinary symptoms, chest pain, shortness of breath, vaginal discharge, pelvic pain, history of abdominal surgeries, fever/chills, or any other pain or complaints. Pain does not change with eating, BM, or passing gas.    Past Medical History  Diagnosis Date  . AV nodal re-entry tachycardia [facility])    Past Surgical History  Procedure Laterality Date  . Ablation  2002, 2009   Family History  Problem Relation Age of Onset  . Hypertension Father   . Hyperlipidemia Father   . Hypertension Brother   . Hyperlipidemia Brother   . Heart attack Maternal Grandmother   . Hyperlipidemia Maternal Grandmother   . Multiple sclerosis Maternal Grandmother   . Fibromyalgia Maternal Grandmother   . Alcohol abuse Paternal Grandmother   . Heart attack Paternal Grandfather   . Hypertension Paternal Grandfather   . Hyperlipidemia Paternal Grandfather   . Stroke Paternal Grandfather    Social History   Substance Use Topics  . Smoking status: Former Games developer  . Smokeless tobacco: None  . Alcohol Use: Yes   OB History    No data available     Review of Systems  Constitutional: Negative for fever, chills, diaphoresis and unexpected weight change.  Respiratory: Negative for cough, chest tightness and shortness of breath.   Cardiovascular: Negative for chest pain, palpitations and leg swelling.  Gastrointestinal: Positive for nausea and abdominal pain. Negative for vomiting, diarrhea, constipation and blood in stool.  Genitourinary: Negative for dysuria, hematuria, flank pain, vaginal bleeding, vaginal discharge, vaginal pain and pelvic pain.  Musculoskeletal: Negative for back pain.  Skin: Negative for color change and pallor.  Neurological: Negative for dizziness, syncope, weakness and light-headedness.  All other systems reviewed and are negative.     Allergies  Other  Home Medications   Prior to Admission medications   Medication Sig Start Date End Date Taking? Authorizing Provider  ALPRAZolam Prudy Feeler) 0.5 MG tablet Take one tablet as needed for anxiety. 07/21/13  Yes Jomarie Longs, PA-C  levonorgestrel (MIRENA) 20 MCG/24HR IUD 1 each by Intrauterine route once.   Yes Historical Provider, MD  metoprolol succinate (TOPROL-XL) 50 MG 24 hr tablet Take 1 tablet (50 mg total) by mouth daily. Take with or immediately following a meal. Patient taking differently: Take 50 mg by mouth daily as needed (heart rate increase.). Take with or immediately following a meal. 09/22/13  Yes Jade L Breeback, PA-C  amoxicillin-clavulanate (AUGMENTIN)  875-125 MG tablet Take 1 tablet by mouth 2 (two) times daily. Patient not taking: Reported on 12/24/2014 12/16/14   Waldon MerlWilliam C Martin, PA-C   BP 157/88 mmHg  Pulse 94  Temp(Src) 98 F (36.7 C) (Oral)  Resp 19  Ht 5\' 9"  (1.753 m)  Wt 185 lb (83.915 kg)  BMI 27.31 kg/m2  SpO2 100% Physical Exam  Constitutional: She appears well-developed and  well-nourished. No distress.  HENT:  Head: Normocephalic and atraumatic.  Eyes: Conjunctivae are normal. Pupils are equal, round, and reactive to light.  Cardiovascular: Normal rate, regular rhythm and normal heart sounds.   Pulmonary/Chest: Effort normal and breath sounds normal. No respiratory distress.  Abdominal: Soft. Normal appearance and bowel sounds are normal. She exhibits no mass. There is tenderness in the right lower quadrant. There is tenderness at McBurney's point. There is negative Murphy's sign.  Abdomen is tender in the lower right quadrant. Pressure on the lower left quadrant elicits pain in the lower right quadrant.  Musculoskeletal: She exhibits no edema or tenderness.  Neurological: She is alert.  Skin: Skin is warm and dry. She is not diaphoretic.  Nursing note and vitals reviewed.   ED Course  Procedures (including critical care time) Labs Review Labs Reviewed  COMPREHENSIVE METABOLIC PANEL - Abnormal; Notable for the following:    Glucose, Bld 136 (*)    All other components within normal limits  URINALYSIS, ROUTINE W REFLEX MICROSCOPIC (NOT [facility]) - Abnormal; Notable for the following:    APPearance CLOUDY (*)    Specific Gravity, Urine 1.004 (*)    Leukocytes, UA TRACE (*)    All other components within normal limits  URINE MICROSCOPIC-ADD ON - Abnormal; Notable for the following:    Squamous Epithelial / LPF 0-5 (*)    All other components within normal limits  LIPASE, BLOOD  CBC  I-STAT BETA HCG BLOOD, ED ([facility], [facility], [facility] ONLY)    Imaging Review Ct Abdomen Pelvis W Contrast  12/24/2014  CLINICAL DATA:  Right lower quadrant pain and tenderness since yesterday. Nausea. EXAM: CT ABDOMEN AND PELVIS WITH CONTRAST TECHNIQUE: Multidetector CT imaging of the abdomen and pelvis was performed using the standard protocol following bolus administration of intravenous contrast. CONTRAST:  100mL OMNIPAQUE IOHEXOL 300 MG/ML  SOLN COMPARISON:  None. FINDINGS: Lower  chest:  No acute findings. Hepatobiliary: No masses or other significant abnormality. Gallbladder is unremarkable. Pancreas: No mass, inflammatory changes, or other significant abnormality. Spleen: Within normal limits in size and appearance. Adrenals/Urinary Tract: No masses identified. No evidence of hydronephrosis. Stomach/Bowel: Appendix since enlarged measuring 10 mm in diameter with wall thickening and mild periappendiceal inflammatory change. This is consistent with acute appendicitis. No evidence of bowel obstruction. No evidence of abscess. Tiny amount of free fluid seen in pelvic cul-de-sac. Vascular/Lymphatic: No pathologically enlarged lymph nodes. No evidence of abdominal aortic aneurysm. Reproductive: Retroverted uterus seen with IUD in place. Adnexal regions are unremarkable. Other: None. Musculoskeletal:  No suspicious bone lesions identified. IMPRESSION: Positive for acute appendicitis. Small amount of free fluid in pelvis, but no evidence of abscess new Electronically Signed   By: Myles RosenthalJohn  Stahl M.D.   On: 12/24/2014 18:14   I have personally reviewed and evaluated these images and lab results as part of my medical decision-making.   EKG Interpretation None      MDM   Final diagnoses:  Right lower quadrant abdominal pain  Nausea  Acute appendicitis, unspecified acute appendicitis type    Lawson FiscalAngela O Buhman presents  with RLQ abdominal pain accompanied by nausea.  Findings and plan of care discussed with Jerelyn Scott, MD.  4:35 PM attempted to go make patient contact, but patient is not yet in the room. This patient's complaint and history are consistent with a suspicion for appendicitis, thus a CT of the abdomen is indicated. Patient will also need IV fluids, pain management, and Zofran. Patient does not have an increased white blood cell count, has no significant abnormalities on CMP, lipase is normal, pregnancy test is negative, and her analysis results are consistent with  dehydration. Plan of care was shared with the patient and the patient's husband at the bedside, both parties agreed with the plan. 6:01 PM patient was reassessed and states her pain is now a 2 out of 10 and her nausea is resolved, but states that just prior to receiving the morphine her pain had increased sharply. As I was walking out patient was being taken to CT. Patient's abdominal CT shows acute appendicitis. CT results were delivered to the patient. Dr. Karma Ganja to contact general surgery. Patient's last oral intake was at 12:30 PM today. Patient states that her pain and nausea are well-controlled at this time. Patient was informed of the process that will take place from here on out. 7:20 PM patient was again reassessed and requested additional pain medication.  Anselm Pancoast, PA-C 12/24/14 2025  Jerelyn Scott, MD 12/24/14 [phone]

## 2014-12-24 NOTE — H&P (Signed)
Stephanie Rush is an 39 y.o. female.   Chief Complaint: abd pain HPI: 39 yo female who developed generalized abd pain yesterday associated with nausea. Had subjective chills as well. This am pain became localized to RLQ. No prior symptoms. +anorexia. Got light headed around lunchtime and tried to eat 1/2 bowl of cereal which didn't tolerate. Came to ED. Works at [employer]. No tob/etoh. Took 1 dose of beta blocker this afternoon. No cp/sob/doe.   Past Medical History  Diagnosis Date  . AV nodal re-entry tachycardia [facility])     Past Surgical History  Procedure Laterality Date  . Ablation  2002, 2009    Family History  Problem Relation Age of Onset  . Hypertension Father   . Hyperlipidemia Father   . Hypertension Brother   . Hyperlipidemia Brother   . Heart attack Maternal Grandmother   . Hyperlipidemia Maternal Grandmother   . Multiple sclerosis Maternal Grandmother   . Fibromyalgia Maternal Grandmother   . Alcohol abuse Paternal Grandmother   . Heart attack Paternal Grandfather   . Hypertension Paternal Grandfather   . Hyperlipidemia Paternal Grandfather   . Stroke Paternal Grandfather    Social History:  reports that she has quit smoking. She does not have any smokeless tobacco history on file. She reports that she drinks alcohol. She reports that she does not use illicit drugs.  Allergies:  Allergies  Allergen Reactions  . Other     Any medication that increases heart rate: pt has re-entry arrhythmia.     (Not in a hospital admission)  Results for orders placed or performed during the hospital encounter of 12/24/14 (from the past 48 hour(s))  Urinalysis, Routine w reflex microscopic (not at [facility])     Status: Abnormal   Collection Time: 12/24/14  2:50 PM  Result Value Ref Range   Color, Urine YELLOW YELLOW   APPearance CLOUDY (A) CLEAR   Specific Gravity, Urine 1.004 (L) 1.005 - 1.030   pH 7.0 5.0 - 8.0   Glucose, UA NEGATIVE NEGATIVE mg/dL   Hgb urine dipstick  NEGATIVE NEGATIVE   Bilirubin Urine NEGATIVE NEGATIVE   Ketones, ur NEGATIVE NEGATIVE mg/dL   Protein, ur NEGATIVE NEGATIVE mg/dL   Nitrite NEGATIVE NEGATIVE   Leukocytes, UA TRACE (A) NEGATIVE  Urine microscopic-add on     Status: Abnormal   Collection Time: 12/24/14  2:50 PM  Result Value Ref Range   Squamous Epithelial / LPF 0-5 (A) NONE SEEN    Comment: Please note change in reference range.   WBC, UA 0-5 0 - 5 WBC/hpf    Comment: Please note change in reference range.   RBC / HPF NONE SEEN 0 - 5 RBC/hpf    Comment: Please note change in reference range.   Bacteria, UA NONE SEEN NONE SEEN    Comment: Please note change in reference range.  Lipase, blood     Status: None   Collection Time: 12/24/14  3:05 PM  Result Value Ref Range   Lipase 26 11 - 51 U/L  Comprehensive metabolic panel     Status: Abnormal   Collection Time: 12/24/14  3:05 PM  Result Value Ref Range   Sodium 139 135 - 145 mmol/L   Potassium 3.7 3.5 - 5.1 mmol/L   Chloride 106 101 - 111 mmol/L   CO2 24 22 - 32 mmol/L   Glucose, Bld 136 (H) 65 - 99 mg/dL   BUN 10 6 - 20 mg/dL   Creatinine, Ser 0.71 0.44 -  1.00 mg/dL   Calcium 9.4 8.9 - 10.3 mg/dL   Total Protein 7.4 6.5 - 8.1 g/dL   Albumin 4.5 3.5 - 5.0 g/dL   AST 16 15 - 41 U/L   ALT 21 14 - 54 U/L   Alkaline Phosphatase 77 38 - 126 U/L   Total Bilirubin 1.2 0.3 - 1.2 mg/dL   GFR calc non Af Amer >60 >60 mL/min   GFR calc Af Amer >60 >60 mL/min    Comment: (NOTE) The eGFR has been calculated using the CKD EPI equation. This calculation has not been validated in all clinical situations. eGFR's persistently <60 mL/min signify possible Chronic Kidney Disease.    Anion gap 9 5 - 15  CBC     Status: None   Collection Time: 12/24/14  3:05 PM  Result Value Ref Range   WBC 8.4 4.0 - 10.5 K/uL   RBC 4.57 3.87 - 5.11 MIL/uL   Hemoglobin 14.1 12.0 - 15.0 g/dL   HCT 40.0 36.0 - 46.0 %   MCV 87.5 78.0 - 100.0 fL   MCH 30.9 26.0 - 34.0 pg   MCHC 35.3 30.0  - 36.0 g/dL   RDW 12.4 11.5 - 15.5 %   Platelets 221 150 - 400 K/uL  I-Stat beta hCG blood, ED ([facility], [facility], [facility] only)     Status: None   Collection Time: 12/24/14  3:20 PM  Result Value Ref Range   I-stat hCG, quantitative <5.0 <5 mIU/mL   Comment 3            Comment:   GEST. AGE      CONC.  (mIU/mL)   <=1 WEEK        5 - 50     2 WEEKS       50 - 500     3 WEEKS       100 - 10,000     4 WEEKS     1,000 - 30,000        FEMALE AND NON-PREGNANT FEMALE:     LESS THAN 5 mIU/mL    Ct Abdomen Pelvis W Contrast  12/24/2014  CLINICAL DATA:  Right lower quadrant pain and tenderness since yesterday. Nausea. EXAM: CT ABDOMEN AND PELVIS WITH CONTRAST TECHNIQUE: Multidetector CT imaging of the abdomen and pelvis was performed using the standard protocol following bolus administration of intravenous contrast. CONTRAST:  164m OMNIPAQUE IOHEXOL 300 MG/ML  SOLN COMPARISON:  None. FINDINGS: Lower chest:  No acute findings. Hepatobiliary: No masses or other significant abnormality. Gallbladder is unremarkable. Pancreas: No mass, inflammatory changes, or other significant abnormality. Spleen: Within normal limits in size and appearance. Adrenals/Urinary Tract: No masses identified. No evidence of hydronephrosis. Stomach/Bowel: Appendix since enlarged measuring 10 mm in diameter with wall thickening and mild periappendiceal inflammatory change. This is consistent with acute appendicitis. No evidence of bowel obstruction. No evidence of abscess. Tiny amount of free fluid seen in pelvic cul-de-sac. Vascular/Lymphatic: No pathologically enlarged lymph nodes. No evidence of abdominal aortic aneurysm. Reproductive: Retroverted uterus seen with IUD in place. Adnexal regions are unremarkable. Other: None. Musculoskeletal:  No suspicious bone lesions identified. IMPRESSION: Positive for acute appendicitis. Small amount of free fluid in pelvis, but no evidence of abscess new Electronically Signed   By: JEarle GellM.D.   On:  12/24/2014 18:14    Review of Systems  Constitutional: Positive for chills. Negative for weight loss.  HENT: Negative for nosebleeds.   Eyes: Negative for blurred vision.  Respiratory:  Negative for shortness of breath.   Cardiovascular: Negative for chest pain, palpitations, orthopnea and PND.       H/o re-entrant tachycardia, had ablation at [facility] twice. Released from their care; takes a beta blocker for planned physical activity or stressful situations.   Gastrointestinal: Positive for nausea and abdominal pain.       Had a transient episode of RUQ pain to shoulder about a month ago  Genitourinary: Negative for dysuria and hematuria.  Musculoskeletal: Negative.   Skin: Negative for itching and rash.  Neurological: Negative for dizziness, focal weakness, seizures, loss of consciousness and headaches.       Denies TIAs, amaurosis fugax  Endo/Heme/Allergies: Does not bruise/bleed easily.  Psychiatric/Behavioral: The patient is not nervous/anxious.     Blood pressure 157/88, pulse 94, temperature 98 F (36.7 C), temperature source Oral, resp. rate 19, height _0  (1.753 m), weight 83.915 kg (185 lb), SpO2 100 %. Physical Exam  Vitals reviewed. Constitutional: She is oriented to person, place, and time. She appears well-developed and well-nourished. No distress.  smiling  HENT:  Head: Normocephalic and atraumatic.  Right Ear: External ear normal.  Left Ear: External ear normal.  Eyes: Conjunctivae are normal. No scleral icterus.  Neck: Normal range of motion. Neck supple. No tracheal deviation present. No thyromegaly present.  Cardiovascular: Normal rate and normal heart sounds.   Respiratory: Effort normal and breath sounds normal. No stridor. No respiratory distress. She has no wheezes.  GI: Soft. She exhibits no distension. There is tenderness in the right lower quadrant. There is tenderness at McBurney's point. There is no rebound and no guarding.  Musculoskeletal: She  exhibits no edema or tenderness.  Lymphadenopathy:    She has no cervical adenopathy.  Neurological: She is alert and oriented to person, place, and time. She exhibits normal muscle tone.  Skin: Skin is warm and dry. No rash noted. She is not diaphoretic. No erythema. No pallor.  Psychiatric: She has a normal mood and affect. Her behavior is normal. Judgment and thought content normal.     Assessment/Plan Acute appendicitis Av nodal re-entry tachycardia Anxiety  We discussed the etiology and management of acute appendicitis. We discussed operative and nonoperative management.  I recommended operative management along with IV antibiotics.  We discussed laparoscopic appendectomy. We discussed the risk and benefits of surgery including but not limited to bleeding, infection, injury to surrounding structures, need to convert to an open procedure, blood clot formation, post operative abscess or wound infection, staple line complications such as leak or bleeding, hernia formation, post operative ileus, need for additional procedures, anesthesia complications, and the typical postoperative course. I explained that the patient should expect a good improvement in their symptoms.  Leighton Ruff. Redmond Pulling, MD, FACS General, Bariatric, & Minimally Invasive Surgery [facility], [place]   [facility] M 12/24/2014, 7:54 PM

## 2014-12-24 NOTE — Anesthesia Preprocedure Evaluation (Addendum)
Anesthesia Evaluation  Patient identified by MRN, date of birth, ID band Patient awake    Reviewed: Allergy & Precautions, NPO status , Patient's Chart, lab work & pertinent test results, reviewed documented beta blocker date and time   History of Anesthesia Complications Negative for: history of anesthetic complications  Airway Mallampati: I  TM Distance: >3 FB Neck ROM: Full    Dental  (+) Teeth Intact, Dental Advisory Given   Pulmonary former smoker,  S/p chest congestion and round of amoxicillin finished 11/18. Denies fever, productive cough   Pulmonary exam normal breath sounds clear to auscultation       Cardiovascular Exercise Tolerance: Good hypertension, Pt. on medications and Pt. on home beta blockers (-) angina(-) CAD and (-) Past MI Normal cardiovascular exam+ dysrhythmias (AV nodal re-entrant tachycardia s/p ablation x2. Takes metoprolol PRN tachycardia (~10x per year))  Rhythm:Regular Rate:Normal     Neuro/Psych PSYCHIATRIC DISORDERS Anxiety Depression negative neurological ROS     GI/Hepatic Neg liver ROS, Appendicitis   Endo/Other  negative endocrine ROS  Renal/GU negative Renal ROS  negative genitourinary   Musculoskeletal negative musculoskeletal ROS (+)   Abdominal   Peds  Hematology negative hematology ROS (+)   Anesthesia Other Findings Day of surgery medications reviewed with the patient.  Reproductive/Obstetrics negative OB ROS                            Anesthesia Physical Anesthesia Plan  ASA: II and emergent  Anesthesia Plan: General   Post-op Pain Management:    Induction: Intravenous  Airway Management Planned: Oral ETT  Additional Equipment:   Intra-op Plan:   Post-operative Plan: Extubation in OR  Informed Consent: I have reviewed the patients History and Physical, chart, labs and discussed the procedure including the risks, benefits and  alternatives for the proposed anesthesia with the patient or authorized representative who has indicated his/her understanding and acceptance.   Dental advisory given  Plan Discussed with: CRNA  Anesthesia Plan Comments: (Risks/benefits of general anesthesia discussed with patient including risk of damage to teeth, lips, gum, and tongue, nausea/vomiting, allergic reactions to medications, and the possibility of heart attack, stroke and death.  All patient questions answered.  Patient wishes to proceed.  Last ate at 13:30--bowl of cereal;  PO contrast at 1630.)        Anesthesia Quick Evaluation

## 2014-12-24 NOTE — Anesthesia Procedure Notes (Signed)
Procedure Name: Intubation Date/Time: 12/24/2014 8:25 PM Performed by: Leroy LibmanEARDON, Symphoni Helbling L Patient Re-evaluated:Patient Re-evaluated prior to inductionOxygen Delivery Method: Circle system utilized Preoxygenation: Pre-oxygenation with 100% oxygen Intubation Type: IV induction, Rapid sequence and Cricoid Pressure applied Laryngoscope Size: Miller and 2 Grade View: Grade I Tube type: Oral Tube size: 7.5 mm Number of attempts: 1 Airway Equipment and Method: Stylet Placement Confirmation: ETT inserted through vocal cords under direct vision,  breath sounds checked- equal and bilateral and positive ETCO2 Secured at: 21 cm Tube secured with: Tape Dental Injury: Teeth and Oropharynx as per pre-operative assessment

## 2014-12-24 NOTE — ED Notes (Signed)
Called pt for blood draw,  No answer. 

## 2014-12-24 NOTE — Addendum Note (Signed)
Addendum  created 12/24/14 2149 by Florene Routeiana L Ori Trejos, CRNA   Modules edited: Anesthesia Events, Narrator   Narrator:  Narrator: Event Log Edited

## 2014-12-24 NOTE — Op Note (Signed)
Stephanie Rush Castleman [id number] 02-Sep-1975 12/24/2014  Appendectomy, Lap, Procedure Note  Indications: The patient presented with a history of right-sided abdominal pain. A CT revealed findings consistent with acute appendicitis.  Pre-operative Diagnosis: acute appendicitis  Post-operative Diagnosis: Same  Surgeon: Atilano InaWILSON,Adamari Frede M   Assistants: none  Anesthesia: General endotracheal anesthesia  ASA Class: 2, E  Procedure Details  The patient was seen again in the Holding Room. The risks, benefits, complications, treatment options, and expected outcomes were discussed with the patient and/or family. The possibilities of perforation of viscus, bleeding, recurrent infection, the need for additional procedures, failure to diagnose a condition, and creating a complication requiring transfusion or operation were discussed. There was concurrence with the proposed plan and informed consent was obtained. The site of surgery was properly noted. The patient was taken to Operating Room, identified as Stephanie Rush Mahabir and the procedure verified as Appendectomy. A Time Out was held and the above information confirmed. She received IV antibiotics preop.  The patient was placed in the supine position and general anesthesia was induced, along with placement of orogastric tube, SCDs, and a Foley catheter. The abdomen was prepped and draped in a sterile fashion. A 1.5 centimeter infraumbilical incision was made.  The umbilical stalk was elevated, and the midline fascia was incised with a #11 blade.  A Kelly clamp was used to confirm entrance into the peritoneal cavity.  A pursestring suture was passed around the incision with a 0 Vicryl.  A 12mm Hasson was introduced into the abdomen and the tails of the suture were used to hold the Hasson in place.   The pneumoperitoneum was then established to steady pressure of 15 mmHg.  Additional 5 mm cannulas then placed in the left lower quadrant of the abdomen and the  suprapubic region under direct visualization. A careful evaluation of the entire abdomen was carried out. The patient was placed in Trendelenburg and left lateral decubitus position. The small intestines were retracted in the cephalad and left lateral direction away from the pelvis and right lower quadrant. The patient was found to have an inflammed appendix that was in the right mid abdomen on top of the cecum. There was no evidence of perforation. There was about 20 cc of yellowish in the pelvis  The appendix was carefully dissected. The appendix was was skeletonized with the harmonic scalpel.   The appendix was divided at its base using an endo-GIA stapler with a white load. No appendiceal stump was left in place. The appendix was removed from the abdomen with an Endocatch bag through the umbilical port.  There was no evidence of bleeding, leakage, or complication after division of the appendix. Irrigation was also performed and irrigate suctioned from the abdomen as well.  The umbilical port site was closed with the purse string suture. The closure was viewed laparoscopically. There was no residual palpable fascial defect or air leak.  The trocar site skin wounds were closed with 4-0 Monocryl. Dermabond was applied to the skin incisions.  Instrument, sponge, and needle counts were correct at the conclusion of the case.   Findings: The appendix was found to be inflamed. There were not signs of necrosis.  There was not perforation. There was not abscess formation.  Estimated Blood Loss:  Minimal         Drains: none         Specimens: appendix         Complications:  None; patient tolerated the procedure well.  Disposition: PACU - hemodynamically stable.         Condition: stable  Mary Sella. Andrey Campanile, MD, FACS General, Bariatric, & Minimally Invasive Surgery [facility], [place]

## 2014-12-24 NOTE — ED Notes (Signed)
Abd pain generalized yesterday. Today sharp lower right sided pain worsening with movement, especially walking/lifting right leg. Complaining of nausea. Denies vomiting, fever, chills, diarrhea. Rebound tenderness present in abdomin

## 2014-12-24 NOTE — Transfer of Care (Signed)
Immediate Anesthesia Transfer of Care Note  Patient: Stephanie Rush  Procedure(s) Performed: Procedure(s): APPENDECTOMY LAPAROSCOPIC (N/A)  Patient Location: PACU  Anesthesia Type:General  Level of Consciousness: awake, alert  and oriented  Airway & Oxygen Therapy: Patient Spontanous Breathing and Patient connected to face mask oxygen  Post-op Assessment: Report given to RN and Post -op Vital signs reviewed and stable  Post vital signs: Reviewed and stable  Last Vitals:  Filed Vitals:   12/24/14 1920  BP: 157/88  Pulse: 94  Temp:   Resp: 19    Complications: No apparent anesthesia complications

## 2014-12-24 NOTE — Anesthesia Postprocedure Evaluation (Signed)
  Anesthesia Post-op Note  Patient: Stephanie Rush  Procedure(s) Performed: Procedure(s) (LRB): APPENDECTOMY LAPAROSCOPIC (N/A)  Patient Location: PACU  Anesthesia Type: General  Level of Consciousness: awake and alert   Airway and Oxygen Therapy: Patient Spontanous Breathing  Post-op Pain: mild  Post-op Assessment: Post-op Vital signs reviewed, Patient's Cardiovascular Status Stable, Respiratory Function Stable, Patent Airway and No signs of Nausea or vomiting  Last Vitals:  Filed Vitals:   12/24/14 1920  BP: 157/88  Pulse: 94  Temp:   Resp: 19    Post-op Vital Signs: stable   Complications: No apparent anesthesia complications

## 2014-12-24 NOTE — ED Notes (Signed)
Patient presents to the ED from home with complaints of RLQ pain since yesterday.  Patient states the pain began in mid-abdomen and localized to RLQ earlier this morning.  Patient endorses nausea, but denies vomiting and diarrhea.  Patient has not treated the pain or nausea at home.  Patient states movement makes pain.  On exam, patients lung sounds are clear to auscultation with no wheezing or crackles.  Heart sounds WNL, S1/S2, no rub, murmur or gallop.  +2 radial and pedal pulses.  No pre-tibial and pedal edema.  Patient's abdomen is soft, but tender to palpation.  Bowel sounds present, but diminished.  Skin warm and dry.

## 2014-12-25 LAB — BASIC METABOLIC PANEL
ANION GAP: 8 (ref 5–15)
BUN: 8 mg/dL (ref 6–20)
CHLORIDE: 106 mmol/L (ref 101–111)
CO2: 24 mmol/L (ref 22–32)
CREATININE: 0.68 mg/dL (ref 0.44–1.00)
Calcium: 8.6 mg/dL — ABNORMAL LOW (ref 8.9–10.3)
GFR calc non Af Amer: >60 mL/min (ref >60)
Glucose, Bld: 179 mg/dL — ABNORMAL HIGH (ref 65–99)
POTASSIUM: 4.7 mmol/L (ref 3.5–5.1)
SODIUM: 138 mmol/L (ref 135–145)

## 2014-12-25 LAB — CBC
HEMATOCRIT: 37.5 % (ref 36.0–46.0)
HEMOGLOBIN: 13.1 g/dL (ref 12.0–15.0)
MCH: 30.8 pg (ref 26.0–34.0)
MCHC: 34.9 g/dL (ref 30.0–36.0)
MCV: 88 fL (ref 78.0–100.0)
PLATELETS: 199 10*3/uL (ref 150–400)
RBC: 4.26 MIL/uL (ref 3.87–5.11)
RDW: 12.5 % (ref 11.5–15.5)
WBC: 10.2 10*3/uL (ref 4.0–10.5)

## 2014-12-25 MED ORDER — OXYCODONE HCL 5 MG PO TABS: 5.0000 mg | ORAL_TABLET | ORAL | Status: DC | PRN

## 2014-12-25 NOTE — Progress Notes (Signed)
Patient ID: Stephanie Rush, female   DOB: 15-Aug-1975, 39 y.o.   MRN: [id number]  General Surgery [facility]  POD#: 1  Subjective: Patient in bed, husband at bedside.  Tolerated clear liquids for breakfast, ambulated.  Pain controlled with oral med.  Objective: Vital signs in last 24 hours: Temp:  [97.8 F (36.6 C)-98.5 F (36.9 C)] 97.8 F (36.6 C) (11/20 0433) Pulse Rate:  [77-111] 77 (11/20 0433) Resp:  [9-19] 16 (11/20 0433) BP: (123-157)/(6-95) 123/6 mmHg (11/20 0433) SpO2:  [98 %-100 %] 100 % (11/20 0433) Weight:  [83.915 kg (185 lb)] 83.915 kg (185 lb) (11/19 1432) Last BM Date: 12/24/14  Intake/Output from previous day: 11/19 0701 - 11/20 0700 In: 1516.7 [I.V.:1516.7] Out: 475 [Urine:450; Blood:25] Intake/Output this shift: Total I/O In: 360 [P.O.:360] Out: -   Physical Exam: HEENT - sclerae clear, mucous membranes moist Neck - soft Chest - clear bilaterally Cor - RRR Abdomen - soft without distension; wounds dry and intact Ext - no edema, non-tender Neuro - alert & oriented, no focal deficits  Lab Results:   Recent Labs  12/24/14 1505 12/25/14 0529  WBC 8.4 10.2  HGB 14.1 13.1  HCT 40.0 37.5  PLT 221 199   BMET  Recent Labs  12/24/14 1505 12/25/14 0529  NA 139 138  K 3.7 4.7  CL 106 106  CO2 24 24  GLUCOSE 136* 179*  BUN 10 8  CREATININE 0.71 0.68  CALCIUM 9.4 8.6*   PT/INR No results for input(s): LABPROT, INR in the last 72 hours. Comprehensive Metabolic Panel:    Component Value Date/Time   NA 138 12/25/2014 0529   NA 139 12/24/2014 1505   K 4.7 12/25/2014 0529   K 3.7 12/24/2014 1505   CL 106 12/25/2014 0529   CL 106 12/24/2014 1505   CO2 24 12/25/2014 0529   CO2 24 12/24/2014 1505   BUN 8 12/25/2014 0529   BUN 10 12/24/2014 1505   CREATININE 0.68 12/25/2014 0529   CREATININE 0.71 12/24/2014 1505   CREATININE 0.68 08/04/2013 1256   GLUCOSE 179* 12/25/2014 0529   GLUCOSE 136* 12/24/2014 1505   CALCIUM 8.6* 12/25/2014 0529   CALCIUM 9.4 12/24/2014 1505   AST 16 12/24/2014 1505   AST 17 08/04/2013 1256   ALT 21 12/24/2014 1505   ALT 23 08/04/2013 1256   ALKPHOS 77 12/24/2014 1505   ALKPHOS 68 08/04/2013 1256   BILITOT 1.2 12/24/2014 1505   BILITOT 0.5 08/04/2013 1256   PROT 7.4 12/24/2014 1505   PROT 7.0 08/04/2013 1256   ALBUMIN 4.5 12/24/2014 1505   ALBUMIN 4.7 08/04/2013 1256    Studies/Results: Ct Abdomen Pelvis W Contrast  12/24/2014  CLINICAL DATA:  Right lower quadrant pain and tenderness since yesterday. Nausea. EXAM: CT ABDOMEN AND PELVIS WITH CONTRAST TECHNIQUE: Multidetector CT imaging of the abdomen and pelvis was performed using the standard protocol following bolus administration of intravenous contrast. CONTRAST:  100mL OMNIPAQUE IOHEXOL 300 MG/ML  SOLN COMPARISON:  None. FINDINGS: Lower chest:  No acute findings. Hepatobiliary: No masses or other significant abnormality. Gallbladder is unremarkable. Pancreas: No mass, inflammatory changes, or other significant abnormality. Spleen: Within normal limits in size and appearance. Adrenals/Urinary Tract: No masses identified. No evidence of hydronephrosis. Stomach/Bowel: Appendix since enlarged measuring 10 mm in diameter with wall thickening and mild periappendiceal inflammatory change. This is consistent with acute appendicitis. No evidence of bowel obstruction. No evidence of abscess. Tiny amount of free fluid seen in pelvic  cul-de-sac. Vascular/Lymphatic: No pathologically enlarged lymph nodes. No evidence of abdominal aortic aneurysm. Reproductive: Retroverted uterus seen with IUD in place. Adnexal regions are unremarkable. Other: None. Musculoskeletal:  No suspicious bone lesions identified. IMPRESSION: Positive for acute appendicitis. Small amount of free fluid in pelvis, but no evidence of abscess new Electronically Signed   By: Myles Rosenthal M.D.   On: 12/24/2014 18:14    Anti-infectives: Anti-infectives    Start      Dose/Rate Route Frequency Ordered Stop   12/24/14 1915  cefTRIAXone (ROCEPHIN) 2 g in dextrose 5 % 50 mL IVPB     2 g 100 mL/hr over 30 Minutes Intravenous  Once 12/24/14 1902 12/24/14 1954   12/24/14 1915  metroNIDAZOLE (FLAGYL) IVPB 500 mg     500 mg 100 mL/hr over 60 Minutes Intravenous  Once 12/24/14 1902 12/24/14 2052      Assessment & Plans: Status post lap appendectomy  Advance diet  OOB, ambulate  Anticipate discharge home after lunch meal today  Velora Heckler, MD, [facility] Office: [phone]   Ardit Danh Judie Petit 12/25/2014

## 2014-12-25 NOTE — Progress Notes (Signed)
Patient discharged home with belongings, IVs and tele removed. AVS/scripts given and patient teach back performed. Patient stable at time of discharge.

## 2014-12-25 NOTE — Discharge Summary (Signed)
  Physician Discharge Summary [facility]  Patient ID: Stephanie Rush MRN: [id number] DOB/AGE: 05/25/1975 39 y.o.  Admit date: 12/24/2014 Discharge date: 12/25/2014  Admission Diagnoses:  Acute appendicitis  Discharge Diagnoses:  Active Problems:   Appendicitis   Discharged Condition: good  Hospital Course: patient admitted after lap appendectomy by Dr. Gaynelle AduEric Wilson for acute appendicitis.  Post op course uncomplicated.  Prepared for discharge on POD#1.  Consults: None  Treatments: surgery: lap appendectomy  Discharge Exam: Blood pressure 123/6, pulse 77, temperature 97.8 F (36.6 C), temperature source Oral, resp. rate 16, height 5\' 9"  (1.753 m), weight 83.915 kg (185 lb), SpO2 100 %. See progress note day of discharge.   Disposition: Home   Discharge Instructions    Diet - low sodium heart healthy    Complete by:  As directed      Discharge instructions    Complete by:  As directed   During your recent anesthetic, you were given the medication sugammadex (Bridion). This medication interacts with hormonal forms of birth control (oral contraceptives and injected or implanted birth control) and may make them ineffective. IFYOU USE ANY HORMONAL FORM OF BIRTH CONTROL, YOU MUST USE AN ADDITIONAL BARRIER BIRTH CONTROL FOR METHOD FOR SEVEN DAYS after receiving sugammadex (Bridion) or there is a chance you could become pregnant.     Increase activity slowly    Complete by:  As directed      No dressing needed    Complete by:  As directed             Medication List    TAKE these medications        ALPRAZolam 0.5 MG tablet  Commonly known as:  XANAX  Take one tablet as needed for anxiety.     amoxicillin-clavulanate 875-125 MG tablet  Commonly known as:  AUGMENTIN  Take 1 tablet by mouth 2 (two) times daily.     levonorgestrel 20 MCG/24HR IUD  Commonly known as:  MIRENA  1 each by Intrauterine route once.     metoprolol succinate 50 MG 24 hr  tablet  Commonly known as:  TOPROL-XL  Take 1 tablet (50 mg total) by mouth daily. Take with or immediately following a meal.     oxyCODONE 5 MG immediate release tablet  Commonly known as:  Oxy IR/ROXICODONE  Take 1-2 tablets (5-10 mg total) by mouth every 4 (four) hours as needed for moderate pain.           Follow-up Information    Follow up with Atilano InaWILSON,ERIC M, MD. Schedule an appointment as soon as possible for a visit in 2 weeks.   Specialty:  General Surgery   Why:  For wound re-check   Contact information:   [address] [place] [address] [phone]       Velora Hecklerodd M. Amarius Toto, MD, [facility] Office: [phone]   Signed: Velora HecklerGERKIN,Cameran Ahmed M 12/25/2014, 1:32 PM

## 2014-12-26 ENCOUNTER — Encounter (HOSPITAL_COMMUNITY): Payer: Self-pay | Admitting: General Surgery

## 2014-12-26 NOTE — Telephone Encounter (Signed)
Is she referring to anti-mullerian hormone [facility])? Does she want anything ordered other than cmp and lipid? Does she have any other concerns?

## 2014-12-30 ENCOUNTER — Encounter: Payer: Self-pay | Admitting: Physician Assistant

## 2015-01-02 ENCOUNTER — Other Ambulatory Visit: Payer: Self-pay | Admitting: Physician Assistant

## 2015-01-02 DIAGNOSIS — Z13220 Encounter for screening for lipoid disorders: Secondary | ICD-10-CM

## 2015-01-02 DIAGNOSIS — Z131 Encounter for screening for diabetes mellitus: Secondary | ICD-10-CM

## 2015-01-02 DIAGNOSIS — Z0000 Encounter for general adult medical examination without abnormal findings: Secondary | ICD-10-CM

## 2015-01-03 LAB — COMPLETE METABOLIC PANEL WITH GFR
ALBUMIN: 4.3 g/dL (ref 3.6–5.1)
ALK PHOS: 70 U/L (ref 33–115)
ALT: 38 U/L — ABNORMAL HIGH (ref 6–29)
AST: 18 U/L (ref 10–30)
BUN: 10 mg/dL (ref 7–25)
CALCIUM: 9.3 mg/dL (ref 8.6–10.2)
CO2: 25 mmol/L (ref 20–31)
Chloride: 102 mmol/L (ref 98–110)
Creat: 0.68 mg/dL (ref 0.50–1.10)
GFR, Est Non African American: >89 mL/min (ref >=60)
Glucose, Bld: 92 mg/dL (ref 65–99)
POTASSIUM: 4.6 mmol/L (ref 3.5–5.3)
Sodium: 138 mmol/L (ref 135–146)
Total Bilirubin: 0.7 mg/dL (ref 0.2–1.2)
Total Protein: 6.5 g/dL (ref 6.1–8.1)

## 2015-01-03 LAB — LIPID PANEL
CHOL/HDL RATIO: 5.1 ratio — AB (ref <=5.0)
CHOLESTEROL: 185 mg/dL (ref 125–200)
HDL: 36 mg/dL — AB (ref >=46)
LDL Cholesterol: 128 mg/dL (ref <130)
TRIGLYCERIDES: 103 mg/dL (ref <150)
VLDL: 21 mg/dL (ref <30)

## 2015-01-04 ENCOUNTER — Encounter: Payer: Self-pay | Admitting: Physician Assistant

## 2015-01-04 ENCOUNTER — Ambulatory Visit (INDEPENDENT_AMBULATORY_CARE_PROVIDER_SITE_OTHER): Payer: 59 | Admitting: Physician Assistant

## 2015-01-04 VITALS — BP 137/74 | HR 74 | Ht 69.0 in | Wt 181.0 lb

## 2015-01-04 DIAGNOSIS — Z0000 Encounter for general adult medical examination without abnormal findings: Secondary | ICD-10-CM

## 2015-01-04 DIAGNOSIS — R1011 Right upper quadrant pain: Secondary | ICD-10-CM | POA: Diagnosis not present

## 2015-01-04 DIAGNOSIS — F411 Generalized anxiety disorder: Secondary | ICD-10-CM

## 2015-01-04 DIAGNOSIS — K3580 Unspecified acute appendicitis: Secondary | ICD-10-CM

## 2015-01-04 DIAGNOSIS — R748 Abnormal levels of other serum enzymes: Secondary | ICD-10-CM | POA: Diagnosis not present

## 2015-01-04 DIAGNOSIS — I10 Essential (primary) hypertension: Secondary | ICD-10-CM

## 2015-01-04 MED ORDER — VILAZODONE HCL 20 MG PO TABS: 1.0000 | ORAL_TABLET | Freq: Every day | ORAL | Status: DC

## 2015-01-04 MED ORDER — ALPRAZOLAM 0.5 MG PO TABS: ORAL_TABLET | ORAL | Status: DC

## 2015-01-04 MED ORDER — METOPROLOL SUCCINATE ER 50 MG PO TB24: 50.0000 mg | ORAL_TABLET | Freq: Every day | ORAL | Status: DC

## 2015-01-04 NOTE — Patient Instructions (Signed)

## 2015-01-04 NOTE — Progress Notes (Signed)
Subjective:    Patient ID: Stephanie Rush, female    DOB: 1975/05/23, 39 y.o.   MRN: [id number]  HPI    Review of Systems     Objective:   Physical Exam        Assessment & Plan:   Subjective:     Stephanie Rush is a 39 y.o. female and is here for a comprehensive physical exam. The patient reports problems - last weekend had her appendix removed acutely. she is doing well. no pain today. she does discuss about 2 weeks ago she had RUQ pain that radiated to back one night. pain is 8/10 and almost went to ER. it was after eating. resolved and not happened again. she is concerned about her gallbladder. she asked the surgeon to take out of gallbladder as well. ofcourse he declined. CT scan did not show any abnormalities with the gallbladder. .  Social History   Social History  . Marital Status: Married    Spouse Name: N/A  . Number of Children: N/A  . Years of Education: N/A   Occupational History  . Not on file.   Social History Main Topics  . Smoking status: Former Games developer  . Smokeless tobacco: Not on file  . Alcohol Use: Yes  . Drug Use: No  . Sexual Activity: Yes   Other Topics Concern  . Not on file   Social History Narrative   Health Maintenance  Topic Date Due  . HIV Screening  03/04/1990  . INFLUENZA VACCINE  09/05/2015  . PAP SMEAR  09/22/2016  . TETANUS/TDAP  06/14/2019     Review of Systems Pertinent items noted in HPI and remainder of comprehensive ROS otherwise negative.   Objective:    BP 137/74 mmHg  Pulse 74  Ht  (1.753 m)  Wt 181 lb (82.101 kg)  BMI 26.72 kg/m2 General appearance: alert, cooperative, appears stated age and mildly obese Head: Normocephalic, without obvious abnormality, atraumatic Eyes: conjunctivae/corneas clear. PERRL, EOM's intact. Fundi benign. Ears: normal TM's and external ear canals both ears Nose: Nares normal. Septum midline. Mucosa normal. No drainage or sinus tenderness. Throat: lips, mucosa, and  tongue normal; teeth and gums normal Neck: no adenopathy, no carotid bruit, no JVD, supple, symmetrical, trachea midline and thyroid not enlarged, symmetric, no tenderness/mass/nodules Back: symmetric, no curvature. ROM normal. No CVA tenderness. Lungs: clear to auscultation bilaterally Heart: regular rate and rhythm, S1, S2 normal, no murmur, click, rub or gallop Abdomen: soft, non-tender; bowel sounds normal; no masses,  no organomegaly Extremities: extremities normal, atraumatic, no cyanosis or edema Pulses: 2+ and symmetric Skin: Skin color, texture, turgor normal. No rashes or lesions Lymph nodes: Cervical, supraclavicular, and axillary nodes normal. Neurologic: Grossly normal    Pelvic: no adenxal masses or tenderness. IUD strings palpated.  Breast exam: no masses, tenderness abnormalities of either breast.    Assessment:    Healthy female exam.     Plan:    CPE/overweight- pt lost 41lbs since last visit in one year. Fantastic.  LDL 128, TG 103, HDL 36. Encouraged pt to increase exercise and good fats. IUd will follow up for removal in one year.   Elevated liver enzymes- likely due to post operation for appendicitis. Will recheck in 2 weeks.   RUQ pain- since only happened once and negative murphys signs will hold off on ultrasound. If comes back let me know. Certainly sounds suspicious of gallbladder. cmp did not show any signs other than the one liver  enzyme elevated. We will recheck this. Discussed this could have been some prodromal symptoms leading up to appendicitis.  HTN- borderline today. Pt not taking medication regularly. Encouraged pt to do so.    GAD- doing great. Refilled today.   See After Visit Summary for Counseling Recommendations

## 2015-01-06 ENCOUNTER — Encounter: Payer: 59 | Admitting: Physician Assistant

## 2015-01-06 DIAGNOSIS — F411 Generalized anxiety disorder: Secondary | ICD-10-CM | POA: Insufficient documentation

## 2015-01-06 DIAGNOSIS — R748 Abnormal levels of other serum enzymes: Secondary | ICD-10-CM | POA: Insufficient documentation

## 2015-02-01 ENCOUNTER — Other Ambulatory Visit (HOSPITAL_COMMUNITY)
Admission: AD | Admit: 2015-02-01 | Discharge: 2015-02-01 | Disposition: A | Discharge status: Home / Self Care | Payer: 59 | Source: Ambulatory Visit | Attending: Physician Assistant | Admitting: Physician Assistant

## 2015-02-01 ENCOUNTER — Encounter: Payer: Self-pay | Admitting: Physician Assistant

## 2015-02-01 DIAGNOSIS — R748 Abnormal levels of other serum enzymes: Secondary | ICD-10-CM | POA: Insufficient documentation

## 2015-02-01 LAB — HEPATIC FUNCTION PANEL
ALK PHOS: 68 U/L (ref 38–126)
ALT: 26 U/L (ref 14–54)
AST: 17 U/L (ref 15–41)
Albumin: 4.4 g/dL (ref 3.5–5.0)
TOTAL PROTEIN: 7.2 g/dL (ref 6.5–8.1)
Total Bilirubin: 0.4 mg/dL (ref 0.3–1.2)

## 2015-02-02 ENCOUNTER — Other Ambulatory Visit: Payer: Self-pay | Admitting: Physician Assistant

## 2015-02-02 ENCOUNTER — Ambulatory Visit (INDEPENDENT_AMBULATORY_CARE_PROVIDER_SITE_OTHER): Payer: 59 | Admitting: Family Medicine

## 2015-02-02 ENCOUNTER — Encounter: Payer: Self-pay | Admitting: Family Medicine

## 2015-02-02 VITALS — BP 117/74 | HR 68 | Resp 16 | Ht 69.0 in | Wt 183.0 lb

## 2015-02-02 DIAGNOSIS — B354 Tinea corporis: Secondary | ICD-10-CM

## 2015-02-02 DIAGNOSIS — Z30433 Encounter for removal and reinsertion of intrauterine contraceptive device: Secondary | ICD-10-CM | POA: Diagnosis not present

## 2015-02-02 DIAGNOSIS — Z01812 Encounter for preprocedural laboratory examination: Secondary | ICD-10-CM | POA: Diagnosis not present

## 2015-02-02 DIAGNOSIS — Z30430 Encounter for insertion of intrauterine contraceptive device: Secondary | ICD-10-CM

## 2015-02-02 LAB — POCT URINE PREGNANCY: Preg Test, Ur: NEGATIVE

## 2015-02-02 MED ORDER — VILAZODONE HCL 20 MG PO TABS: 1.0000 | ORAL_TABLET | Freq: Every day | ORAL | Status: DC

## 2015-02-02 MED ORDER — LEVONORGESTREL 20 MCG/24HR IU IUD
INTRAUTERINE_SYSTEM | Freq: Once | INTRAUTERINE | Status: AC
  Administered 2015-02-02: 17:00:00 via INTRAUTERINE

## 2015-02-02 MED ORDER — NYSTATIN 100000 UNIT/GM EX CREA: 1.0000 "application " | TOPICAL_CREAM | Freq: Two times a day (BID) | CUTANEOUS | Status: DC

## 2015-02-02 NOTE — Progress Notes (Signed)
    Subjective: Patient here for IUD change.  Has been in for 5 years and due to be changed. She has no cycles and desires to continue Mirena IUD for contraception.  Objective: Filed Vitals:   02/02/15 1435  BP: 117/74  Pulse: 68  Resp: 16    Patient identified, informed consent performed, signed copy in chart, time out was performed Procedure: Speculum placed inside vagina.  Cervix visualized.  Strings grasped with ring forceps.  IUD removed intact.  Procedure: Cervix visualized.  Cleaned with Betadine x 2.  Grasped anteriourly with a single tooth tenaculum.  Uterus sounded to 8 cm.  Mirena IUD placed per manufacturer's recommendations.  Strings trimmed to 3 cm.   Patient given post procedure instructions and Mirena care card with expiration date.  Patient is asked to check IUD strings periodically and follow up in 4-6 weeks for IUD check.  Impression: Pre-procedure lab exam - Plan: POCT urine pregnancy  Encounter for IUD insertion - Plan: levonorgestrel (MIRENA) 20 MCG/24HR IUD Small area of tinea corporis at umbilicus from prior surgery treated with Nystatin.  Plan: Continue IUD for 5 years.

## 2015-02-02 NOTE — Patient Instructions (Signed)
Levonorgestrel intrauterine device (IUD) What is this medicine? LEVONORGESTREL IUD (LEE voe nor jes trel) is a contraceptive (birth control) device. The device is placed inside the uterus by a healthcare professional. It is used to prevent pregnancy and can also be used to treat heavy bleeding that occurs during your period. Depending on the device, it can be used for 3 to 5 years. This medicine may be used for other purposes; ask your health care provider or pharmacist if you have questions. What should I tell my health care provider before I take this medicine? They need to know if you have any of these conditions: -abnormal Pap smear -cancer of the breast, uterus, or cervix -diabetes -endometritis -genital or pelvic infection now or in the past -have more than one sexual partner or your partner has more than one partner -heart disease -history of an ectopic or tubal pregnancy -immune system problems -IUD in place -liver disease or tumor -problems with blood clots or take blood-thinners -use intravenous drugs -uterus of unusual shape -vaginal bleeding that has not been explained -an unusual or allergic reaction to levonorgestrel, other hormones, silicone, or polyethylene, medicines, foods, dyes, or preservatives -pregnant or trying to get pregnant -breast-feeding How should I use this medicine? This device is placed inside the uterus by a health care professional. Talk to your pediatrician regarding the use of this medicine in children. Special care may be needed. Overdosage: If you think you have taken too much of this medicine contact a poison control center or emergency room at once. NOTE: This medicine is only for you. Do not share this medicine with others. What if I miss a dose? This does not apply. What may interact with this medicine? Do not take this medicine with any of the following medications: -amprenavir -bosentan -fosamprenavir This medicine may also interact with  the following medications: -aprepitant -barbiturate medicines for inducing sleep or treating seizures -bexarotene -griseofulvin -medicines to treat seizures like carbamazepine, ethotoin, felbamate, oxcarbazepine, phenytoin, topiramate -modafinil -pioglitazone -rifabutin -rifampin -rifapentine -some medicines to treat HIV infection like atazanavir, indinavir, lopinavir, nelfinavir, tipranavir, ritonavir -St. John's wort -warfarin This list may not describe all possible interactions. Give your health care provider a list of all the medicines, herbs, non-prescription drugs, or dietary supplements you use. Also tell them if you smoke, drink alcohol, or use illegal drugs. Some items may interact with your medicine. What should I watch for while using this medicine? Visit your doctor or health care professional for regular check ups. See your doctor if you or your partner has sexual contact with others, becomes HIV positive, or gets a sexual transmitted disease. This product does not protect you against HIV infection (AIDS) or other sexually transmitted diseases. You can check the placement of the IUD yourself by reaching up to the top of your vagina with clean fingers to feel the threads. Do not pull on the threads. It is a good habit to check placement after each menstrual period. Call your doctor right away if you feel more of the IUD than just the threads or if you cannot feel the threads at all. The IUD may come out by itself. You may become pregnant if the device comes out. If you notice that the IUD has come out use a backup birth control method like condoms and call your health care provider. Using tampons will not change the position of the IUD and are okay to use during your period. What side effects may I notice from receiving this medicine?   Side effects that you should report to your doctor or health care professional as soon as possible: -allergic reactions like skin rash, itching or  hives, swelling of the face, lips, or tongue -fever, flu-like symptoms -genital sores -high blood pressure -no menstrual period for 6 weeks during use -pain, swelling, warmth in the leg -pelvic pain or tenderness -severe or sudden headache -signs of pregnancy -stomach cramping -sudden shortness of breath -trouble with balance, talking, or walking -unusual vaginal bleeding, discharge -yellowing of the eyes or skin Side effects that usually do not require medical attention (report to your doctor or health care professional if they continue or are bothersome): -acne -breast pain -change in sex drive or performance -changes in weight -cramping, dizziness, or faintness while the device is being inserted -headache -irregular menstrual bleeding within first 3 to 6 months of use -nausea This list may not describe all possible side effects. Call your doctor for medical advice about side effects. You may report side effects to FDA at 1-800-FDA-1088. Where should I keep my medicine? This does not apply. NOTE: This sheet is a summary. It may not cover all possible information. If you have questions about this medicine, talk to your doctor, pharmacist, or health care provider.    2016, Elsevier/Gold Standard. (2011-02-21 13:54:04)  

## 2015-02-03 ENCOUNTER — Ambulatory Visit: Payer: Self-pay | Admitting: Physician Assistant

## 2015-02-08 MED FILL — VIIBRYD 20 MG TABLET: 20 | 90 days supply | Qty: 90 | Fill #0

## 2015-02-09 ENCOUNTER — Ambulatory Visit: Payer: 59 | Admitting: Obstetrics & Gynecology

## 2015-05-18 ENCOUNTER — Other Ambulatory Visit: Payer: Self-pay | Admitting: Physician Assistant

## 2015-05-18 MED FILL — VIIBRYD 20 MG TABLET: 20 | 30 days supply | Qty: 30 | Fill #1

## 2015-05-18 MED FILL — METOPROLOL SUCC ER 50 MG TA: 50 | 90 days supply | Qty: 90 | Fill #1

## 2015-05-18 MED FILL — ALPRAZolam 0.5 MG TABS: 0.5 | 30 days supply | Qty: 30 | Fill #1

## 2015-06-20 MED FILL — VIIBRYD 20 MG TABLET: 20 | 90 days supply | Qty: 90 | Fill #0

## 2015-06-28 ENCOUNTER — Telehealth: Payer: Self-pay | Admitting: *Deleted

## 2015-06-28 DIAGNOSIS — B029 Zoster without complications: Secondary | ICD-10-CM

## 2015-06-28 MED ORDER — VALACYCLOVIR HCL 1 G PO TABS: 1000.0000 mg | ORAL_TABLET | Freq: Three times a day (TID) | ORAL | Status: DC

## 2015-06-28 MED FILL — valACYclovir HCL 1 GM TABS: 1 | 7 days supply | Qty: 21 | Fill #0

## 2015-06-28 NOTE — Telephone Encounter (Signed)
Dr Shawnie PonsPratt evaluated lesions that resembles shingles per pt request. Dr Shawnie PonsPratt verbally authorized Valtrex to be sent to pharmacy.

## 2015-07-01 ENCOUNTER — Telehealth: Payer: 59 | Admitting: Nurse Practitioner

## 2015-07-01 DIAGNOSIS — B029 Zoster without complications: Secondary | ICD-10-CM | POA: Diagnosis not present

## 2015-07-01 NOTE — Progress Notes (Signed)
Based on what you shared with me it looks like you have a serious condition that should be evaluated in a face to face office visit.  If you are having a true medical emergency please call 911.  If you need an urgent face to face visit, [facility] has four urgent care centers for your convenience.  . [facility]  [phone] Get Driving Directions Find a Provider at this Location  [address] [place], [place] [address] . 8 am to 8 pm Monday-Friday . 9 am to 7 pm Saturday-Sunday  . [facility]  [phone] Get Driving Directions Find a Provider at this Location  [address], [address] [place], [place] [address] . 8 am to 8 pm Monday-Friday . 9 am to 6 pm Saturday . 11 am to 6 pm Sunday   . [facility] [place]  [phone] Get Driving Directions  [address] [place], [place] [address] . 8 am to 8 pm Monday-Friday . 9 am to 4 pm Saturday-Sunday   . [facility] (a walk in primary care provider)  [phone]  Get Driving Directions Find a Provider at this Location  [address] [place],  [address] . 8 am to 8:30 pm Monday-Thursday . 8 am to 6 pm Friday . 8 am to 4 pm Saturday-Sunday   Your e-visit answers were reviewed by a board certified advanced clinical practitioner to complete your personal care plan.  Thank you for using e-Visits. 

## 2015-08-10 ENCOUNTER — Other Ambulatory Visit: Payer: Self-pay | Admitting: Physician Assistant

## 2015-08-18 ENCOUNTER — Ambulatory Visit: Payer: 59

## 2015-08-18 ENCOUNTER — Encounter: Payer: Self-pay | Admitting: Physician Assistant

## 2015-08-23 MED FILL — METOPROLOL SUCC ER 50 MG TA: 50 | 30 days supply | Qty: 30 | Fill #0

## 2015-09-12 ENCOUNTER — Ambulatory Visit (INDEPENDENT_AMBULATORY_CARE_PROVIDER_SITE_OTHER): Payer: 59 | Admitting: Physician Assistant

## 2015-09-12 ENCOUNTER — Encounter: Payer: Self-pay | Admitting: Physician Assistant

## 2015-09-12 VITALS — BP 136/78 | HR 74 | Ht 69.0 in | Wt 209.0 lb

## 2015-09-12 DIAGNOSIS — I471 Supraventricular tachycardia: Secondary | ICD-10-CM

## 2015-09-12 DIAGNOSIS — F329 Major depressive disorder, single episode, unspecified: Secondary | ICD-10-CM

## 2015-09-12 DIAGNOSIS — F411 Generalized anxiety disorder: Secondary | ICD-10-CM

## 2015-09-12 DIAGNOSIS — I10 Essential (primary) hypertension: Secondary | ICD-10-CM | POA: Diagnosis not present

## 2015-09-12 DIAGNOSIS — F32A Depression, unspecified: Secondary | ICD-10-CM

## 2015-09-12 DIAGNOSIS — E669 Obesity, unspecified: Secondary | ICD-10-CM

## 2015-09-12 MED ORDER — METOPROLOL SUCCINATE ER 50 MG PO TB24: 50.0000 mg | ORAL_TABLET | Freq: Every day | ORAL | 0 refills | Status: DC

## 2015-09-12 MED ORDER — LORCASERIN HCL 10 MG PO TABS: 1.0000 | ORAL_TABLET | Freq: Two times a day (BID) | ORAL | 2 refills | Status: DC

## 2015-09-12 MED ORDER — VILAZODONE HCL 20 MG PO TABS: 1.0000 | ORAL_TABLET | Freq: Every day | ORAL | 0 refills | Status: DC

## 2015-09-12 MED FILL — METOPROLOL SUCC ER 50 MG TA: 50 | 90 days supply | Qty: 90 | Fill #0

## 2015-09-12 MED FILL — VIIBRYD 20 MG TABLET: 20 | 90 days supply | Qty: 90 | Fill #0

## 2015-09-12 NOTE — Progress Notes (Signed)
   Subjective:    Patient ID: Stephanie Rush, female    DOB: 08-22-1975, 40 y.o.   MRN: [id number]  HPI Pt presents to the clinic for medication refill.   HTN/AVnodal re-entry tachycardia- doing well no problems. Taking metprolol daily now and feeling better. Not checking BP regularly. No CP, dizziness or headaches.   Depression/GAD- doing wonderful on Viibryd. No problems or concerns.   Obesity- frustrated with weight. Trying to lose again. She was down to 180 after appendectomy but starting to gain all weight back. Admits to not exercising or watching diet.    Review of Systems  All other systems reviewed and are negative.      Objective:   Physical Exam  Constitutional: She is oriented to person, place, and time. She appears well-developed and well-nourished.  HENT:  Head: Normocephalic and atraumatic.  Cardiovascular: Normal rate, regular rhythm and normal heart sounds.   Pulmonary/Chest: Effort normal and breath sounds normal.  Neurological: She is alert and oriented to person, place, and time.  Psychiatric: She has a normal mood and affect. Her behavior is normal.          Assessment & Plan:  Hypertension/AV nodal reentry tachycardia- 2nd BP recheck better. Discussed goal and to keep check at work. Continue metoprolol. Follow up in 3 months.   Depression/GAD- refilled viibryd for 3 months. Rarely using xanax. Has plenty per pt.   Obesity- not a candidate for phentermine. Discussed belviq, contrave and saxenda. Will try belviq again. Discussed side effects. Coupon card given. Follow up in 3 months.

## 2015-10-16 ENCOUNTER — Encounter: Payer: Self-pay | Admitting: Physician Assistant

## 2015-10-17 NOTE — Telephone Encounter (Signed)
OK to send over starter pack for contrave.

## 2015-10-18 ENCOUNTER — Other Ambulatory Visit: Payer: Self-pay | Admitting: Physician Assistant

## 2015-10-18 MED ORDER — NALTREXONE-BUPROPION HCL ER 8-90 MG PO TB12: ORAL_TABLET | ORAL | 0 refills | Status: DC

## 2015-10-23 ENCOUNTER — Telehealth: Payer: Self-pay | Admitting: *Deleted

## 2015-10-23 NOTE — Telephone Encounter (Signed)
PA for Contrave initiated through [employer]  [id number] - PA Case ID: [id number]

## 2015-10-27 NOTE — Telephone Encounter (Signed)
Not sure why denied; may be due to when initially submitted response was additional info needed; resubmitted PA

## 2015-10-27 NOTE — Telephone Encounter (Signed)
Your request has been denied  PA Case [id number] is denied. For further questions, call [phone].

## 2015-11-04 ENCOUNTER — Encounter: Payer: Self-pay | Admitting: Physician Assistant

## 2015-11-06 NOTE — Telephone Encounter (Signed)
Spoke with Pt, advised we will route to PCP about possibly changing Dx code from daughters visits.

## 2015-11-09 NOTE — Telephone Encounter (Signed)
Your request has been n/a  We received your request for prior authorization for [facility], for the above member; however, [employer] has a denied request on file for CONTRAVE for this member. Please follow the appeals process outlined in the original denial or contact Prior Authorization Department at [phone] for further questions.   Not sure why this was denied patient's BMI falls within range. [employer] CorporationCalled insurance company and she basically read the criteria to me but didn't tell me specifically why her particular case was denied. Asked rep for the # to send the letter of appeal because the rep herself did not seem knowledgeable. She said she woul fax the denial letter with instructions on ghow to send appeal   Called and left a message on patient's vm to let her know where we are in the process and to call back with any questions or concerns

## 2015-11-10 ENCOUNTER — Encounter: Payer: Self-pay | Admitting: *Deleted

## 2015-11-10 NOTE — Telephone Encounter (Signed)
Appeal letter sent 

## 2015-11-15 MED FILL — CONTRAVE ER 8-90 MG TABLET: 8-90 | 28 days supply | Qty: 70 | Fill #0

## 2015-11-15 NOTE — Telephone Encounter (Signed)
Appeal has been approved. Approved for four months through 03/17/2016. Patient and pharm notified

## 2015-11-21 ENCOUNTER — Telehealth: Payer: Self-pay | Admitting: Physician Assistant

## 2015-11-21 NOTE — Telephone Encounter (Signed)
Patient has CPE scheduled for 12/19/15 and would like to have her lab orders placed before her appt

## 2015-12-07 ENCOUNTER — Telehealth: Payer: Self-pay | Admitting: *Deleted

## 2015-12-07 DIAGNOSIS — Z13220 Encounter for screening for lipoid disorders: Secondary | ICD-10-CM

## 2015-12-07 DIAGNOSIS — I10 Essential (primary) hypertension: Secondary | ICD-10-CM

## 2015-12-07 NOTE — Telephone Encounter (Signed)
Fasting labs ordered. Pt notified.

## 2015-12-13 ENCOUNTER — Encounter: Payer: Self-pay | Admitting: *Deleted

## 2015-12-13 ENCOUNTER — Emergency Department (INDEPENDENT_AMBULATORY_CARE_PROVIDER_SITE_OTHER): Payer: 59

## 2015-12-13 ENCOUNTER — Emergency Department: Payer: 59

## 2015-12-13 ENCOUNTER — Emergency Department
Admission: EM | Admit: 2015-12-13 | Discharge: 2015-12-13 | Disposition: A | Discharge status: Home / Self Care | Payer: 59 | Source: Home / Self Care | Attending: Family Medicine | Admitting: Family Medicine

## 2015-12-13 DIAGNOSIS — M79605 Pain in left leg: Secondary | ICD-10-CM

## 2015-12-13 DIAGNOSIS — M79662 Pain in left lower leg: Secondary | ICD-10-CM

## 2015-12-13 HISTORY — DX: Essential (primary) hypertension: I10

## 2015-12-13 MED ORDER — ACETAMINOPHEN 325 MG PO TABS
650.0000 mg | ORAL_TABLET | Freq: Once | ORAL | Status: AC
  Administered 2015-12-13: 650 mg via ORAL

## 2015-12-13 MED ORDER — IBUPROFEN 600 MG PO TABS
600.0000 mg | ORAL_TABLET | Freq: Once | ORAL | Status: AC
  Administered 2015-12-13: 600 mg via ORAL

## 2015-12-13 MED ORDER — HYDROCODONE-ACETAMINOPHEN 5-325 MG PO TABS: 1.0000 | ORAL_TABLET | Freq: Four times a day (QID) | ORAL | 0 refills | Status: DC | PRN

## 2015-12-13 MED ORDER — IBUPROFEN 600 MG PO TABS: 600.0000 mg | ORAL_TABLET | Freq: Four times a day (QID) | ORAL | 0 refills | Status: DC | PRN

## 2015-12-13 NOTE — ED Provider Notes (Signed)
CSN: [id number]     Arrival date & time 12/13/15  0818 History   First MD Initiated Contact with Patient 12/13/15 [phone]     Chief Complaint  Patient presents with  . Leg Pain   (Consider location/radiation/quality/duration/timing/severity/associated sxs/prior Treatment) HPI  Philippa Sicksngela O Fjeld is a 40 y.o. female presenting to UC with c/o Left lower leg pain that started last week but resolved on its own.  Yesterday and today, pain is constant. Pain woke pt from her sleep early this morning. It is sharp in nature, radiated from distal lateral aspect of her leg to her knee.  Worse with ambulation, 8/10.  No pain medication taken today but she did get minimal relief taking ibuprofen the other day.  Denies known injury or similar pain. Denies redness, warmth, or swelling of leg.  Denies thigh or back pain. No prior hx of blood clots. She has an IUD. Does not smoke. No recent travel or surgery. She is not a long distance runner.    Past Medical History:  Diagnosis Date  . AV nodal re-entry tachycardia (HCC)   . Hypertension    Past Surgical History:  Procedure Laterality Date  . ABLATION  2002, 2009   cardiac  . LAPAROSCOPIC APPENDECTOMY N/A 12/24/2014   Procedure: APPENDECTOMY LAPAROSCOPIC;  Surgeon: Gaynelle AduEric Wilson, MD;  Location: [facility];  Service: General;  Laterality: N/A;   Family History  Problem Relation Age of Onset  . Hypertension Father   . Hyperlipidemia Father   . Hypertension Brother   . Hyperlipidemia Brother   . Heart attack Maternal Grandmother   . Hyperlipidemia Maternal Grandmother   . Multiple sclerosis Maternal Grandmother   . Fibromyalgia Maternal Grandmother   . Alcohol abuse Paternal Grandmother   . Heart attack Paternal Grandfather   . Hypertension Paternal Grandfather   . Hyperlipidemia Paternal Grandfather   . Stroke Paternal Grandfather    Social History  Substance Use Topics  . Smoking status: Former Games developermoker  . Smokeless tobacco: Never Used  . Alcohol use  0.0 oz/week     Comment: socially   OB History    Gravida Para Term Preterm AB Living   2 2       2    SAB TAB Ectopic Multiple Live Births                 Review of Systems  Constitutional: Negative for chills and fever.  Musculoskeletal: Positive for arthralgias, gait problem and myalgias. Negative for joint swelling.       Left lower leg  Skin: Negative for color change, rash and wound.  Neurological: Negative for weakness and numbness.    Allergies  Other  Home Medications   Prior to Admission medications   Medication Sig Start Date End Date Taking? Authorizing Provider  ALPRAZolam Prudy Feeler(XANAX) 0.5 MG tablet Take one tablet as needed for anxiety. 01/04/15  Yes Jade Diannia RuderL Breeback, PA-C  levonorgestrel (MIRENA) 20 MCG/24HR IUD 1 each by Intrauterine route once.   Yes Historical Provider, MD  metoprolol succinate (TOPROL-XL) 50 MG 24 hr tablet Take 1 tablet (50 mg total) by mouth daily. TAKE WITH OR IMMEDIATELY FOLLOWING A MEAL. 09/12/15  Yes Jade L Breeback, PA-C  Vilazodone HCl (VIIBRYD) 20 MG TABS Take 1 tablet (20 mg total) by mouth daily. 09/12/15  Yes Jade L Breeback, PA-C  HYDROcodone-acetaminophen (NORCO/VICODIN) 5-325 MG tablet Take 1-2 tablets by mouth every 6 (six) hours as needed for moderate pain or severe pain. 12/13/15   Junius FinnerErin O'Malley,  PA-C  ibuprofen (ADVIL,MOTRIN) 600 MG tablet Take 1 tablet (600 mg total) by mouth every 6 (six) hours as needed. 12/13/15   Junius Finner, PA-C  Lorcaserin HCl 10 MG TABS Take 1 tablet by mouth 2 (two) times daily. 09/12/15   Jomarie Longs, PA-C   Meds Ordered and Administered this Visit   Medications  acetaminophen (TYLENOL) tablet 650 mg (650 mg Oral Given 12/13/15 0859)  ibuprofen (ADVIL,MOTRIN) tablet 600 mg (600 mg Oral Given 12/13/15 1040)    BP 143/89 (BP Location: Left Arm)   Pulse 77   Temp 98.3 F (36.8 C) (Oral)   Ht 5\' 9"  (1.753 m)   Wt 205 lb (93 kg)   SpO2 99%   BMI 30.27 kg/m  No data found.   Physical Exam   Constitutional: She is oriented to person, place, and time. She appears well-developed and well-nourished. No distress.  HENT:  Head: Normocephalic and atraumatic.  Eyes: EOM are normal.  Neck: Normal range of motion.  Cardiovascular: Normal rate.   Pulses:      Dorsalis pedis pulses are 2+ on the left side.       Posterior tibial pulses are 2+ on the left side.  Pulmonary/Chest: Effort normal.  Musculoskeletal: Normal range of motion. She exhibits tenderness. She exhibits no edema.  Left leg: full ROM knee, non-tender. Mild tenderness to lateral distal shin.  No edema or deformity. Full ROM ankle. Tenderness with plantarflexion against resistance.   Neurological: She is alert and oriented to person, place, and time.  Skin: Skin is warm and dry. Capillary refill takes less than 2 seconds. She is not diaphoretic. No erythema.  Left lower leg: skin in tact. No ecchymosis or erythema.  Psychiatric: She has a normal mood and affect. Her behavior is normal.  Nursing note and vitals reviewed.   Urgent Care Course   Clinical Course     Procedures (including critical care time)  Labs Review Labs Reviewed - No data to display  Imaging Review Dg Tibia/fibula Left  Result Date: 12/13/2015 CLINICAL DATA:  Lateral leg discomfort 1 week ago which resolved in then recurred last night. EXAM: LEFT TIBIA AND FIBULA - 2 VIEW COMPARISON:  None in PACs FINDINGS: The bones are subjectively adequately mineralized. There is no acute fracture or dislocation. There is no periosteal reaction. The overlying soft tissues are normal. IMPRESSION: There is no acute bony abnormality of the left tibia or fibula. Electronically Signed   By: David  [place] M.D.   On: 12/13/2015 09:29   US Venous Img Lower Unilateral Left  Result Date: 12/13/2015 CLINICAL DATA:  Left calf pain for 2 weeks EXAM: LEFT LOWER EXTREMITY VENOUS DUPLEX ULTRASOUND TECHNIQUE: Doppler venous assessment of the left lower extremity deep venous  system was performed, including characterization of spectral flow, compressibility, and phasicity. COMPARISON:  None. FINDINGS: There is complete compressibility of the left common femoral, femoral, and popliteal veins. Doppler analysis demonstrates respiratory phasicity and augmentation of flow with calf compression. No obvious superficial vein or calf vein thrombosis. IMPRESSION: No evidence of left lower extremity DVT. Electronically Signed   By: Jolaine Click M.D.   On: 12/13/2015 10:42     MDM   1. Pain in left lower leg   2. Leg pain, left    Pt c/o Left lower leg pain intermittently for 1 week.   PMS in tact. No signs of underlying infection.  Plain films: negative for acute bony injury, no signs of stress fracture. U/S Left  lower leg: no evidence of DVT  Reassured pt. Will treat as muscular pain.  Ace wrap applied for compression. She may use OTC compression stockings for comfort. Rx: Norco for leg pain at night May alternate acetaminophen and ibuprofen during the day F/u with PCP or Sports Medicine in 1-2 weeks if not improving.    Junius Finnerrin O'Malley, PA-C 12/13/15 1104

## 2015-12-13 NOTE — ED Triage Notes (Signed)
Patient c/o LLE pain without injury x yesterday. Same instance happened last week and resolved on its own. Pain radiates into foot. No redness or swelling present. No previous injuries. No pain @ rest, only with walking or pressure.

## 2015-12-19 ENCOUNTER — Encounter: Payer: 59 | Admitting: Physician Assistant

## 2015-12-22 ENCOUNTER — Other Ambulatory Visit: Payer: Self-pay

## 2015-12-22 MED ORDER — METOPROLOL SUCCINATE ER 50 MG PO TB24: 50.0000 mg | ORAL_TABLET | Freq: Every day | ORAL | 0 refills | Status: DC

## 2015-12-22 MED FILL — METOPROLOL SUCC ER 50 MG TA: 50 | 30 days supply | Qty: 30 | Fill #0

## 2016-01-01 ENCOUNTER — Other Ambulatory Visit: Payer: Self-pay | Admitting: Physician Assistant

## 2016-01-01 ENCOUNTER — Encounter: Payer: Self-pay | Admitting: Physician Assistant

## 2016-01-02 MED FILL — VIIBRYD 20 MG TABLET: 20 | 90 days supply | Qty: 90 | Fill #0

## 2016-01-04 ENCOUNTER — Other Ambulatory Visit (HOSPITAL_COMMUNITY)
Admission: AD | Admit: 2016-01-04 | Discharge: 2016-01-04 | Disposition: A | Discharge status: Home / Self Care | Payer: 59 | Source: Ambulatory Visit | Attending: Physician Assistant | Admitting: Physician Assistant

## 2016-01-04 DIAGNOSIS — Z13220 Encounter for screening for lipoid disorders: Secondary | ICD-10-CM | POA: Insufficient documentation

## 2016-01-04 LAB — COMPREHENSIVE METABOLIC PANEL
ALT: 25 U/L (ref 14–54)
AST: 19 U/L (ref 15–41)
Albumin: 4.4 g/dL (ref 3.5–5.0)
Alkaline Phosphatase: 58 U/L (ref 38–126)
Anion gap: 10 (ref 5–15)
BILIRUBIN TOTAL: 0.7 mg/dL (ref 0.3–1.2)
BUN: 11 mg/dL (ref 6–20)
CO2: 25 mmol/L (ref 22–32)
CREATININE: 0.78 mg/dL (ref 0.44–1.00)
Calcium: 9.2 mg/dL (ref 8.9–10.3)
Chloride: 103 mmol/L (ref 101–111)
GFR calc Af Amer: >60 mL/min (ref >60)
Glucose, Bld: 100 mg/dL — ABNORMAL HIGH (ref 65–99)
POTASSIUM: 4.5 mmol/L (ref 3.5–5.1)
Sodium: 138 mmol/L (ref 135–145)
TOTAL PROTEIN: 6.5 g/dL (ref 6.5–8.1)

## 2016-01-04 LAB — LIPID PANEL
CHOLESTEROL: 231 mg/dL — AB (ref 0–200)
HDL: 44 mg/dL (ref >40)
LDL Cholesterol: 175 mg/dL — ABNORMAL HIGH (ref 0–99)
Total CHOL/HDL Ratio: 5.3 RATIO
Triglycerides: 61 mg/dL (ref <150)
VLDL: 12 mg/dL (ref 0–40)

## 2016-01-05 ENCOUNTER — Encounter: Payer: Self-pay | Admitting: Physician Assistant

## 2016-01-05 ENCOUNTER — Ambulatory Visit (INDEPENDENT_AMBULATORY_CARE_PROVIDER_SITE_OTHER): Payer: 59 | Admitting: Physician Assistant

## 2016-01-05 VITALS — BP 128/74 | HR 83 | Ht 69.0 in | Wt 208.0 lb

## 2016-01-05 DIAGNOSIS — Z6830 Body mass index (BMI) 30.0-30.9, adult: Secondary | ICD-10-CM

## 2016-01-05 DIAGNOSIS — E785 Hyperlipidemia, unspecified: Secondary | ICD-10-CM

## 2016-01-05 DIAGNOSIS — I471 Supraventricular tachycardia: Secondary | ICD-10-CM | POA: Diagnosis not present

## 2016-01-05 DIAGNOSIS — E6609 Other obesity due to excess calories: Secondary | ICD-10-CM

## 2016-01-05 DIAGNOSIS — Z1231 Encounter for screening mammogram for malignant neoplasm of breast: Secondary | ICD-10-CM

## 2016-01-05 DIAGNOSIS — I10 Essential (primary) hypertension: Secondary | ICD-10-CM

## 2016-01-05 DIAGNOSIS — Z0000 Encounter for general adult medical examination without abnormal findings: Secondary | ICD-10-CM

## 2016-01-05 DIAGNOSIS — E66811 Obesity, class 1: Secondary | ICD-10-CM

## 2016-01-05 DIAGNOSIS — F330 Major depressive disorder, recurrent, mild: Secondary | ICD-10-CM

## 2016-01-05 DIAGNOSIS — F411 Generalized anxiety disorder: Secondary | ICD-10-CM

## 2016-01-05 DIAGNOSIS — I4719 Other supraventricular tachycardia: Secondary | ICD-10-CM

## 2016-01-05 MED ORDER — METOPROLOL SUCCINATE ER 50 MG PO TB24: 50.0000 mg | ORAL_TABLET | Freq: Every day | ORAL | 1 refills | Status: DC

## 2016-01-05 MED ORDER — BUPROPION HCL ER (XL) 150 MG PO TB24: 150.0000 mg | ORAL_TABLET | Freq: Every day | ORAL | 1 refills | Status: DC

## 2016-01-05 MED FILL — BUPROPION HCL XL 150 MG TAB: 150 | 90 days supply | Qty: 90 | Fill #0

## 2016-01-05 NOTE — Patient Instructions (Signed)
Red yeast rice OTC for cholesterol   Keeping You Healthy  Get These Tests 1. Blood Pressure- Have your blood pressure checked once a year by your health care provider.  Normal blood pressure is 120/80. 2. Weight- Have your body mass index (BMI) calculated to screen for obesity.  BMI is measure of body fat based on height and weight.  You can also calculate your own BMI at https://www.west-esparza.com/www.nhlbisupport.com/bmi/. 3. Cholesterol- Have your cholesterol checked every 5 years starting at age 40 then yearly starting at age 40. 4. Chlamydia, HIV, and other sexually transmitted diseases- Get screened every year until age 40, then within three months of each new sexual provider. 5. Pap Test - Every 1-5 years; discuss with your health care provider. 6. Mammogram- Every 1-2 years starting at age 40--50  Take these medicines  Calcium with Vitamin D-Your body needs 1200 mg of Calcium each day and [phone] IU of Vitamin D daily.  Your body can only absorb 500 mg of Calcium at a time so Calcium must be taken in 2 or 3 divided doses throughout the day.  Multivitamin with folic acid- Once daily if it is possible for you to become pregnant.  Get these Immunizations  Gardasil-Series of three doses; prevents HPV related illness such as genital warts and cervical cancer.  Menactra-Single dose; prevents meningitis.  Tetanus shot- Every 10 years.  Flu shot-Every year.  Take these steps 1. Do not smoke-Your healthcare provider can help you quit.  For tips on how to quit go to www.smokefree.gov or call 1-800 QUITNOW. 2. Be physically active- Exercise 5 days a week for at least 30 minutes.  If you are not already physically active, start slow and gradually work up to 30 minutes of moderate physical activity.  Examples of moderate activity include walking briskly, dancing, swimming, bicycling, etc. 3. Breast Cancer- A self breast exam every month is important for early detection of breast cancer.  For more information and  instruction on self breast exams, ask your healthcare provider or [place].eswww.womenshealth.gov/faq/breast-self-exam.cfm. 4. Eat a healthy diet- Eat a variety of healthy foods such as fruits, vegetables, whole grains, low fat milk, low fat cheeses, yogurt, lean meats, poultry and fish, beans, nuts, tofu, etc.  For more information go to www. Thenutritionsource.org 5. Drink alcohol in moderation- Limit alcohol intake to one drink or less per day. Never drink and drive. 6. Depression- Your emotional health is as important as your physical health.  If you're feeling down or losing interest in things you normally enjoy please talk to your healthcare provider about being screened for depression. 7. Dental visit- Brush and floss your teeth twice daily; visit your dentist twice a year. 8. Eye doctor- Get an eye exam at least every 2 years. 9. Helmet use- Always wear a helmet when riding a bicycle, motorcycle, rollerblading or skateboarding. 10. Safe sex- If you may be exposed to sexually transmitted infections, use a condom. 11. Seat belts- Seat belts can save your live; always wear one. 12. Smoke/Carbon Monoxide detectors- These detectors need to be installed on the appropriate level of your home. Replace batteries at least once a year. 13. Skin cancer- When out in the sun please cover up and use sunscreen 15 SPF or higher. 14. Violence- If anyone is threatening or hurting you, please tell your healthcare provider.

## 2016-01-07 NOTE — Progress Notes (Signed)
Subjective:     Stephanie Rush is a 40 y.o. female and is here for a comprehensive physical exam. The patient reports problems - her biggest concern is her weight. she had done well and lost weight and now gained it back. 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  she has tried contrave before and did well but cost too much.   Social History   Social History  . Marital status: Married    Spouse name: N/A  . Number of children: N/A  . Years of education: N/A   Occupational History  . RN    Social History Main Topics  . Smoking status: Former Games developermoker  . Smokeless tobacco: Never Used  . Alcohol use 0.0 oz/week     Comment: socially  . Drug use: No  . Sexual activity: Yes    Partners: Male   Other Topics Concern  . Not on file   Social History Narrative  . No narrative on file   Health Maintenance  Topic Date Due  . HIV Screening  03/04/1990  . PAP SMEAR  09/22/2016  . TETANUS/TDAP  06/14/2019  . INFLUENZA VACCINE  Completed    The following portions of the patient's history were reviewed and updated as appropriate: allergies, current medications, past family history, past medical history, past social history, past surgical history and problem list.  Review of Systems Pertinent items noted in HPI and remainder of comprehensive ROS otherwise negative.   Objective:    BP 128/74   Pulse 83   Ht 5\' 9"  (1.753 m)   Wt 208 lb (94.3 kg)   BMI 30.72 kg/m  General appearance: alert, cooperative and appears stated age Head: Normocephalic, without obvious abnormality, atraumatic Eyes: conjunctivae/corneas clear. PERRL, EOM's intact. Fundi benign. Ears: normal TM's and external ear canals both ears Nose: Nares normal. Septum midline. Mucosa normal. No drainage or sinus tenderness. Throat: lips, mucosa, and tongue normal; teeth and gums normal Neck: no adenopathy, no carotid bruit, no JVD, supple, symmetrical, trachea midline and  thyroid not enlarged, symmetric, no tenderness/mass/nodules Back: symmetric, no curvature. ROM normal. No CVA tenderness. Lungs: clear to auscultation bilaterally Heart: regular rate and rhythm, S1, S2 normal, no murmur, click, rub or gallop Abdomen: soft, non-tender; bowel sounds normal; no masses,  no organomegaly Extremities: extremities normal, atraumatic, no cyanosis or edema Pulses: 2+ and symmetric Skin: Skin color, texture, turgor normal. No rashes or lesions Lymph nodes: Cervical, supraclavicular, and axillary nodes normal. Neurologic: Alert and oriented X 3, normal strength and tone. Normal symmetric reflexes. Normal coordination and gait    Assessment:    Healthy female exam.      Plan:    Marland Kitchen.Marland Kitchen.Stephanie Rush was seen today for annual exam.  Diagnoses and all orders for this visit:  Routine physical examination  Hyperlipidemia, unspecified hyperlipidemia type  Essential hypertension, benign -     metoprolol succinate (TOPROL-XL) 50 MG 24 hr tablet; Take 1 tablet (50 mg total) by mouth daily.  AV nodal re-entry tachycardia (HCC) -     metoprolol succinate (TOPROL-XL) 50 MG 24 hr tablet; Take 1 tablet (50 mg total) by mouth daily.  GAD (generalized anxiety disorder) -     buPROPion (WELLBUTRIN XL) 150 MG 24 hr tablet; Take 1 tablet (150 mg total) by mouth daily.  Mild episode of recurrent major depressive disorder (HCC) -     buPROPion (WELLBUTRIN XL) 150 MG 24 hr tablet; Take 1 tablet (150 mg  total) by mouth daily.  Class 1 obesity due to excess calories without serious comorbidity with body mass index (BMI) of 30.0 to 30.9 in adult -     buPROPion (WELLBUTRIN XL) 150 MG 24 hr tablet; Take 1 tablet (150 mg total) by mouth daily.  Visit for screening mammogram -     MM DIGITAL SCREENING BILATERAL; Future -     MM DIGITAL SCREENING BILATERAL   Discussed vitamin D 800 units and calcium 1500mg  Vaccines up to date.  Pap up to date.  Encouraged and ordered mammogram.   Discussed labs.  Currently for cholesterol overall cardiac risk is still low at 2-3 percent. Will work on diet changes and weight loss. Consider red yeast rice.  Added wellbutrin to help with anxiety/depression/weight. Pt has previously tolerated.  Discussed 1500 calorie diet and 150 minutes of exercise weekly.  See After Visit Summary for Counseling Recommendations

## 2016-01-23 MED FILL — METOPROLOL SUCC ER 50 MG TA: 50 | 90 days supply | Qty: 90 | Fill #0

## 2016-01-31 DIAGNOSIS — H5213 Myopia, bilateral: Secondary | ICD-10-CM | POA: Diagnosis not present

## 2016-01-31 DIAGNOSIS — H31002 Unspecified chorioretinal scars, left eye: Secondary | ICD-10-CM | POA: Diagnosis not present

## 2016-01-31 DIAGNOSIS — H47093 Other disorders of optic nerve, not elsewhere classified, bilateral: Secondary | ICD-10-CM | POA: Diagnosis not present

## 2016-04-09 ENCOUNTER — Encounter: Payer: Self-pay | Admitting: Physician Assistant

## 2016-04-09 ENCOUNTER — Other Ambulatory Visit: Payer: Self-pay | Admitting: Physician Assistant

## 2016-04-10 ENCOUNTER — Other Ambulatory Visit: Payer: Self-pay | Admitting: Physician Assistant

## 2016-04-10 DIAGNOSIS — Z6830 Body mass index (BMI) 30.0-30.9, adult: Secondary | ICD-10-CM

## 2016-04-10 DIAGNOSIS — I10 Essential (primary) hypertension: Secondary | ICD-10-CM

## 2016-04-10 DIAGNOSIS — I471 Supraventricular tachycardia: Secondary | ICD-10-CM

## 2016-04-10 DIAGNOSIS — F411 Generalized anxiety disorder: Secondary | ICD-10-CM

## 2016-04-10 DIAGNOSIS — E66811 Obesity, class 1: Secondary | ICD-10-CM

## 2016-04-10 DIAGNOSIS — E6609 Other obesity due to excess calories: Secondary | ICD-10-CM

## 2016-04-10 DIAGNOSIS — F330 Major depressive disorder, recurrent, mild: Secondary | ICD-10-CM

## 2016-04-10 DIAGNOSIS — I4719 Other supraventricular tachycardia: Secondary | ICD-10-CM

## 2016-04-10 MED ORDER — VILAZODONE HCL 20 MG PO TABS: 1.0000 | ORAL_TABLET | Freq: Every day | ORAL | 2 refills | Status: DC

## 2016-04-10 MED ORDER — METOPROLOL SUCCINATE ER 50 MG PO TB24: 50.0000 mg | ORAL_TABLET | Freq: Every day | ORAL | 2 refills | Status: DC

## 2016-04-10 MED ORDER — BUPROPION HCL ER (XL) 150 MG PO TB24: 150.0000 mg | ORAL_TABLET | Freq: Every day | ORAL | 2 refills | Status: DC

## 2016-04-10 MED FILL — BUPROPION HCL XL 150 MG TAB: 150 | 90 days supply | Qty: 90 | Fill #0

## 2016-04-10 MED FILL — VIIBRYD 20 MG TABLET: 20 | 30 days supply | Qty: 30 | Fill #0

## 2016-04-22 MED FILL — METOPROLOL SUCC ER 50 MG TA: 50 | 90 days supply | Qty: 90 | Fill #1

## 2016-05-16 MED FILL — VIIBRYD 20 MG TABLET: 20 | 30 days supply | Qty: 30 | Fill #1

## 2016-05-21 ENCOUNTER — Encounter: Payer: Self-pay | Admitting: Physician Assistant

## 2016-06-15 ENCOUNTER — Encounter: Payer: Self-pay | Admitting: Physician Assistant

## 2016-06-17 MED ORDER — ESCITALOPRAM OXALATE 10 MG PO TABS: 10.0000 mg | ORAL_TABLET | Freq: Every day | ORAL | 1 refills | Status: DC

## 2016-06-17 MED FILL — VIIBRYD 20 MG TABLET: 20 | 7 days supply | Qty: 7 | Fill #2

## 2016-06-17 MED FILL — ESCITALOPRAM 10 MG TABLET: 10 | 90 days supply | Qty: 90 | Fill #0

## 2016-07-16 ENCOUNTER — Telehealth: Payer: Self-pay | Admitting: *Deleted

## 2016-07-16 DIAGNOSIS — I10 Essential (primary) hypertension: Secondary | ICD-10-CM

## 2016-07-16 DIAGNOSIS — E785 Hyperlipidemia, unspecified: Secondary | ICD-10-CM

## 2016-07-16 NOTE — Telephone Encounter (Signed)
Labs ordered for cpe per pt request.

## 2016-07-25 MED FILL — BUPROPION XL 150 MG TAB: 150 | 90 days supply | Qty: 90 | Fill #1

## 2016-07-25 MED FILL — METOPROLOL SUCC ER 50 MG TA: 50 | 90 days supply | Qty: 90 | Fill #0

## 2016-09-23 ENCOUNTER — Encounter: Payer: 59 | Admitting: Physician Assistant

## 2016-09-25 MED FILL — ESCITALOPRAM 10 MG TABLET: 10 | 90 days supply | Qty: 90 | Fill #1

## 2016-10-29 MED FILL — METOPROLOL SUCC ER 50 MG TA: 50 | 90 days supply | Qty: 90 | Fill #1

## 2016-11-01 ENCOUNTER — Encounter: Payer: 59 | Admitting: Physician Assistant

## 2016-11-15 MED FILL — buPROPion HCL ER (XL) 150 M: 150 | 90 days supply | Qty: 90 | Fill #2

## 2016-12-13 ENCOUNTER — Other Ambulatory Visit: Payer: Self-pay | Admitting: Physician Assistant

## 2016-12-16 MED FILL — ESCITALOPRAM 10 MG TABLET: 10 | 30 days supply | Qty: 30 | Fill #0

## 2017-01-16 ENCOUNTER — Other Ambulatory Visit: Payer: Self-pay

## 2017-01-16 DIAGNOSIS — I10 Essential (primary) hypertension: Secondary | ICD-10-CM

## 2017-01-16 DIAGNOSIS — E785 Hyperlipidemia, unspecified: Secondary | ICD-10-CM

## 2017-01-16 NOTE — Progress Notes (Signed)
Reordered labs, under [facility] and [facility]

## 2017-01-17 ENCOUNTER — Other Ambulatory Visit (HOSPITAL_COMMUNITY)
Admission: AD | Admit: 2017-01-17 | Discharge: 2017-01-17 | Disposition: A | Discharge status: Home / Self Care | Payer: 59 | Source: Ambulatory Visit | Attending: Physician Assistant | Admitting: Physician Assistant

## 2017-01-17 ENCOUNTER — Encounter: Payer: 59 | Admitting: Physician Assistant

## 2017-01-17 DIAGNOSIS — I10 Essential (primary) hypertension: Secondary | ICD-10-CM | POA: Diagnosis not present

## 2017-01-17 DIAGNOSIS — E785 Hyperlipidemia, unspecified: Secondary | ICD-10-CM | POA: Diagnosis not present

## 2017-01-17 LAB — LIPID PANEL
CHOL/HDL RATIO: 4.9 ratio
Cholesterol: 225 mg/dL — ABNORMAL HIGH (ref 0–200)
HDL: 46 mg/dL (ref >40)
LDL Cholesterol: 157 mg/dL — ABNORMAL HIGH (ref 0–99)
Triglycerides: 111 mg/dL (ref <150)
VLDL: 22 mg/dL (ref 0–40)

## 2017-01-17 LAB — COMPREHENSIVE METABOLIC PANEL
ALBUMIN: 4.3 g/dL (ref 3.5–5.0)
ALT: 30 U/L (ref 14–54)
AST: 20 U/L (ref 15–41)
Alkaline Phosphatase: 74 U/L (ref 38–126)
Anion gap: 7 (ref 5–15)
BUN: 12 mg/dL (ref 6–20)
CO2: 26 mmol/L (ref 22–32)
Calcium: 9 mg/dL (ref 8.9–10.3)
Chloride: 101 mmol/L (ref 101–111)
Creatinine, Ser: 0.83 mg/dL (ref 0.44–1.00)
GFR calc Af Amer: >60 mL/min (ref >60)
GFR calc non Af Amer: >60 mL/min (ref >60)
Glucose, Bld: 94 mg/dL (ref 65–99)
POTASSIUM: 4.1 mmol/L (ref 3.5–5.1)
Sodium: 134 mmol/L — ABNORMAL LOW (ref 135–145)
Total Bilirubin: 0.7 mg/dL (ref 0.3–1.2)
Total Protein: 6.6 g/dL (ref 6.5–8.1)

## 2017-01-20 ENCOUNTER — Encounter: Payer: Self-pay | Admitting: Physician Assistant

## 2017-01-20 ENCOUNTER — Other Ambulatory Visit (HOSPITAL_COMMUNITY)
Admission: RE | Admit: 2017-01-20 | Discharge: 2017-01-20 | Disposition: A | Discharge status: Home / Self Care | Payer: 59 | Source: Ambulatory Visit | Attending: Physician Assistant | Admitting: Physician Assistant

## 2017-01-20 ENCOUNTER — Ambulatory Visit (INDEPENDENT_AMBULATORY_CARE_PROVIDER_SITE_OTHER): Payer: 59 | Admitting: Physician Assistant

## 2017-01-20 VITALS — BP 122/82 | HR 72 | Ht 69.0 in | Wt 215.0 lb

## 2017-01-20 DIAGNOSIS — Z6831 Body mass index (BMI) 31.0-31.9, adult: Secondary | ICD-10-CM | POA: Diagnosis not present

## 2017-01-20 DIAGNOSIS — F411 Generalized anxiety disorder: Secondary | ICD-10-CM | POA: Diagnosis not present

## 2017-01-20 DIAGNOSIS — Z0000 Encounter for general adult medical examination without abnormal findings: Secondary | ICD-10-CM

## 2017-01-20 DIAGNOSIS — E6609 Other obesity due to excess calories: Secondary | ICD-10-CM

## 2017-01-20 DIAGNOSIS — I471 Supraventricular tachycardia: Secondary | ICD-10-CM | POA: Diagnosis not present

## 2017-01-20 DIAGNOSIS — E66811 Obesity, class 1: Secondary | ICD-10-CM

## 2017-01-20 DIAGNOSIS — F330 Major depressive disorder, recurrent, mild: Secondary | ICD-10-CM

## 2017-01-20 DIAGNOSIS — Z1231 Encounter for screening mammogram for malignant neoplasm of breast: Secondary | ICD-10-CM | POA: Diagnosis not present

## 2017-01-20 DIAGNOSIS — Z124 Encounter for screening for malignant neoplasm of cervix: Secondary | ICD-10-CM

## 2017-01-20 DIAGNOSIS — I10 Essential (primary) hypertension: Secondary | ICD-10-CM

## 2017-01-20 DIAGNOSIS — I4719 Other supraventricular tachycardia: Secondary | ICD-10-CM

## 2017-01-20 MED ORDER — LIRAGLUTIDE -WEIGHT MANAGEMENT 18 MG/3ML ~~LOC~~ SOPN: 0.6000 mg | PEN_INJECTOR | Freq: Every day | SUBCUTANEOUS | 1 refills | Status: DC

## 2017-01-20 MED ORDER — BUPROPION HCL ER (XL) 150 MG PO TB24: 150.0000 mg | ORAL_TABLET | Freq: Every day | ORAL | 3 refills | Status: DC

## 2017-01-20 MED ORDER — ESCITALOPRAM OXALATE 10 MG PO TABS: 10.0000 mg | ORAL_TABLET | Freq: Every day | ORAL | 3 refills | Status: DC

## 2017-01-20 MED ORDER — METOPROLOL SUCCINATE ER 50 MG PO TB24: 50.0000 mg | ORAL_TABLET | Freq: Every day | ORAL | 3 refills | Status: DC

## 2017-01-20 NOTE — Patient Instructions (Signed)

## 2017-01-20 NOTE — Progress Notes (Addendum)
Subjective:     Stephanie Rush is a 41 y.o. female and is here for a comprehensive physical exam. The patient reports no problems.   She is doing well on the Lexapro and has rarely had to use Xanax. She reports continued marriage problems, however is seeing a Veterinary surgeoncounselor and reports that it has helped her a lot. She is still taking Wellbutrin and reports the combination with Lexapro helps her feel like herself and does not feel stressed, however she does not experience the decreased appetite or weight loss as a side effect. Denies medication side effects of Lexapro or Wellbutrin  She is taking red yeast rice to lower her cholesterol.   Social History   Socioeconomic History  . Marital status: Married    Spouse name: Not on file  . Number of children: Not on file  . Years of education: Not on file  . Highest education level: Not on file  Social Needs  . Financial resource strain: Not on file  . Food insecurity - worry: Not on file  . Food insecurity - inability: Not on file  . Transportation needs - medical: Not on file  . Transportation needs - non-medical: Not on file  Occupational History  . Occupation: Charity fundraiserN  Tobacco Use  . Smoking status: Former Games developermoker  . Smokeless tobacco: Never Used  Substance and Sexual Activity  . Alcohol use: Yes    Alcohol/week: 0.0 oz    Comment: socially  . Drug use: No  . Sexual activity: Yes    Partners: Male  Other Topics Concern  . Not on file  Social History Narrative  . Not on file   Health Maintenance  Topic Date Due  . PAP SMEAR  09/22/2016  . HIV Screening  01/20/2018 (Originally 03/04/1990)  . TETANUS/TDAP  06/14/2019  . INFLUENZA VACCINE  Completed    The following portions of the patient's history were reviewed and updated as appropriate: allergies, current medications, past family history, past medical history, past social history, past surgical history and problem list.  Review of Systems Pertinent items noted in HPI and  remainder of comprehensive ROS otherwise negative.   Objective:    BP 122/82 (BP Location: Left Arm, Patient Position: Sitting)   Pulse 72   Ht 5\' 9"  (1.753 m)   Wt 215 lb (97.5 kg)   BMI 31.75 kg/m  General appearance: alert, cooperative and no distress Head: Normocephalic, without obvious abnormality, atraumatic Eyes: conjunctivae/corneas clear. PERRL, EOM's intact. Fundi benign. Ears: normal TM's and external ear canals both ears Nose: Nares normal. Septum midline. Mucosa normal. No drainage or sinus tenderness. Throat: lips, mucosa, and tongue normal; teeth and gums normal  Neck: no adenopathy, no carotid bruit, no JVD, supple, symmetrical, trachea midline and thyroid not enlarged, symmetric, no tenderness/mass/nodules Back: symmetric, no curvature. ROM normal. No CVA tenderness Lungs: clear to auscultation bilaterally Breasts: normal appearance, no masses or tenderness Heart: regular rate and rhythm, S1, S2 normal, no murmur, click, rub or gallop Abdomen: soft, non-tender; bowel sounds normal; no masses,  no organomegaly Pelvic: cervix normal in appearance, external genitalia normal, no adnexal masses or tenderness, no cervical motion tenderness, rectovaginal septum normal, uterus normal size, shape, and consistency, vagina normal without discharge and IUD strings visualized and in correct position. Extremities: extremities normal, atraumatic, no cyanosis or edema. Pulses: 2+ and symmetric Skin: Skin color, texture, turgor normal. No rashes or lesions Lymph nodes: Cervical, supraclavicular, and axillary nodes normal. Neurologic: Alert and oriented X 3, normal  strength and tone. Normal symmetric reflexes. Normal coordination and gait    Assessment:    Healthy female exam.       Plan:  .Marland Kitchen.Marland Kitchen.Stephanie Landngela was seen today for annual exam and gynecologic exam.  Diagnoses and all orders for this visit:  Routine physical examination -     MM SCREENING BREAST TOMO BILATERAL; Future -     MM  SCREENING BREAST TOMO BILATERAL -     Cytology - PAP  Essential hypertension, benign -     metoprolol succinate (TOPROL-XL) 50 MG 24 hr tablet; Take 1 tablet (50 mg total) by mouth daily.  AV nodal re-entry tachycardia (HCC) -     metoprolol succinate (TOPROL-XL) 50 MG 24 hr tablet; Take 1 tablet (50 mg total) by mouth daily.  GAD (generalized anxiety disorder) -     buPROPion (WELLBUTRIN XL) 150 MG 24 hr tablet; Take 1 tablet (150 mg total) by mouth daily. -     escitalopram (LEXAPRO) 10 MG tablet; Take 1 tablet (10 mg total) by mouth daily.  Mild episode of recurrent major depressive disorder (HCC) -     buPROPion (WELLBUTRIN XL) 150 MG 24 hr tablet; Take 1 tablet (150 mg total) by mouth daily. -     escitalopram (LEXAPRO) 10 MG tablet; Take 1 tablet (10 mg total) by mouth daily.  Visit for screening mammogram -     MM SCREENING BREAST TOMO BILATERAL; Future -     MM SCREENING BREAST TOMO BILATERAL  Pap smear for cervical cancer screening -     Cytology - PAP  Class 1 obesity due to excess calories without serious comorbidity with body mass index (BMI) of 31.0 to 31.9 in adult -     Liraglutide -Weight Management (SAXENDA) 18 MG/3ML SOPN; Inject 0.6 mg into the skin daily. For one week then increase by .6mg  weekly until reaches 3mg  daily.  Please include ultra fine needles 6mm  .Marland Kitchen. Depression screen PHQ 2/9 01/20/2017  Decreased Interest 1  Down, Depressed, Hopeless 1  PHQ - 2 Score 2     HTN & AV node re-entry tachycardia - Doing well on Metoprolol succinate daily. Continue as prescribed.  GAD & MDD - Patient doing well on Lexapro and Wellbutrin, she denies medication side effects. Encouraged continued counseling to help with coping strategies for her marital problems.  Obesity - Patient is not losing weight on Wellbutrin, will try Saxenda for weight loss.  Advised patient that she needs to lose 5% in 3 months to continue on Saxenda. .Discussed low carb diet with 1500  calories and 80g of protein.  Exercising at least 150 minutes a week.  My Fitness Pal could be a Chief Technology Officergreat resource.  -discussed side effects of saxenda.    Routine physical exam - Vaccines up to date, Pap performed today. Referred for mammogram. Discussed labs, her cholesterol has decreased but is still elevated but ASCVD risk 1.1%. Discussed that she is still at low risk but if her cholesterol levels raise or she develops additional risk factors then we may need to consider adding a statin. Encouraged her to continue red yeast rice.  See After Visit Summary for Counseling Recommendations

## 2017-01-22 LAB — CYTOLOGY - PAP
DIAGNOSIS: NEGATIVE
HPV (WINDOPATH): NOT DETECTED

## 2017-01-31 MED FILL — BUPROPION HCL XL 150 MG TAB: 150 | 90 days supply | Qty: 90 | Fill #0

## 2017-01-31 MED FILL — ESCITALOPRAM 10 MG TABLET: 10 | 90 days supply | Qty: 90 | Fill #0

## 2017-01-31 MED FILL — METOPROLOL SUCC ER 50 MG TA: 50 | 90 days supply | Qty: 90 | Fill #0 | Status: TO

## 2017-02-06 ENCOUNTER — Encounter: Payer: Self-pay | Admitting: Physician Assistant

## 2017-02-06 ENCOUNTER — Telehealth: Payer: Self-pay | Admitting: *Deleted

## 2017-02-06 ENCOUNTER — Ambulatory Visit (INDEPENDENT_AMBULATORY_CARE_PROVIDER_SITE_OTHER): Payer: 59

## 2017-02-06 DIAGNOSIS — Z1231 Encounter for screening mammogram for malignant neoplasm of breast: Secondary | ICD-10-CM

## 2017-02-06 NOTE — Telephone Encounter (Signed)
[id number] Pre Authorization sent to cover my meds.

## 2017-02-07 ENCOUNTER — Ambulatory Visit: Payer: 59

## 2017-02-12 ENCOUNTER — Encounter: Payer: Self-pay | Admitting: Physician Assistant

## 2017-03-07 ENCOUNTER — Telehealth: Payer: Self-pay | Admitting: Physician Assistant

## 2017-03-07 NOTE — Telephone Encounter (Signed)
Received fax for PA on Saxenda sent through cover my meds waiting on determination. - CF 

## 2017-03-12 NOTE — Telephone Encounter (Signed)
Received fax from [employer] and they are still processing the PA for Saxenda - CF

## 2017-03-27 ENCOUNTER — Encounter: Payer: Self-pay | Admitting: Physician Assistant

## 2017-03-27 ENCOUNTER — Telehealth: Payer: 59 | Admitting: Physician Assistant

## 2017-03-27 DIAGNOSIS — M545 Low back pain: Secondary | ICD-10-CM

## 2017-03-27 MED ORDER — CYCLOBENZAPRINE HCL 10 MG PO TABS: 10.0000 mg | ORAL_TABLET | Freq: Three times a day (TID) | ORAL | 0 refills | Status: DC | PRN

## 2017-03-27 NOTE — Progress Notes (Signed)

## 2017-04-17 ENCOUNTER — Telehealth: Payer: Self-pay | Admitting: Physician Assistant

## 2017-04-17 NOTE — Telephone Encounter (Signed)
[facility] and she states that an appeal letter was not received for this medication. [facility] MyChart message sent to the patient.

## 2017-04-17 NOTE — Telephone Encounter (Signed)
Patient Comments: I am still waiting on Prior Authorization for this medication from my December Appt, so I have not started the medication yet. At this point, I don't need a weight re-check. If you have any info on the PA status, please let me know.

## 2017-04-17 NOTE — Telephone Encounter (Signed)
Routing to [place]. 

## 2017-04-21 ENCOUNTER — Ambulatory Visit: Payer: 59 | Admitting: Physician Assistant

## 2017-04-28 NOTE — Telephone Encounter (Deleted)
Received a fax from [facility] that Saxenda should be medically necessary.

## 2017-04-28 NOTE — Telephone Encounter (Deleted)
Received a letter from [facility] and the Sa

## 2017-04-28 NOTE — Telephone Encounter (Signed)
Received fax from [facility] that Saxenda was approved. Pharmacy notified and forms sent to scan.   Reference ID: [id number].

## 2017-04-30 MED FILL — METOPROLOL SUCC ER 50 MG TA: 50 | 90 days supply | Qty: 90 | Fill #1 | Status: TO

## 2017-04-30 MED FILL — ESCITALOPRAM 10 MG TABLET: 10 | 90 days supply | Qty: 90 | Fill #1

## 2017-04-30 MED FILL — buPROPion HCL ER (XL) 150 M: 150 | 90 days supply | Qty: 90 | Fill #1

## 2017-05-15 ENCOUNTER — Ambulatory Visit (INDEPENDENT_AMBULATORY_CARE_PROVIDER_SITE_OTHER): Payer: 59

## 2017-05-15 ENCOUNTER — Ambulatory Visit (INDEPENDENT_AMBULATORY_CARE_PROVIDER_SITE_OTHER): Payer: 59 | Admitting: Sports Medicine

## 2017-05-15 ENCOUNTER — Encounter: Payer: Self-pay | Admitting: Sports Medicine

## 2017-05-15 DIAGNOSIS — M545 Low back pain: Secondary | ICD-10-CM

## 2017-05-15 DIAGNOSIS — M5416 Radiculopathy, lumbar region: Secondary | ICD-10-CM

## 2017-05-15 DIAGNOSIS — M51369 Other intervertebral disc degeneration, lumbar region without mention of lumbar back pain or lower extremity pain: Secondary | ICD-10-CM | POA: Insufficient documentation

## 2017-05-15 DIAGNOSIS — M5136 Other intervertebral disc degeneration, lumbar region: Secondary | ICD-10-CM | POA: Insufficient documentation

## 2017-05-15 MED ORDER — PREDNISONE 50 MG PO TABS: ORAL_TABLET | ORAL | 0 refills | Status: DC

## 2017-05-15 MED ORDER — CYCLOBENZAPRINE HCL 10 MG PO TABS
ORAL_TABLET | ORAL | 0 refills | Status: DC
Start: 2017-05-15 — End: 2018-01-26

## 2017-05-15 MED ORDER — METHYLPREDNISOLONE SODIUM SUCC 125 MG IJ SOLR
125.0000 mg | Freq: Once | INTRAMUSCULAR | Status: AC
  Administered 2017-05-15: 125 mg via INTRAMUSCULAR

## 2017-05-15 MED ORDER — KETOROLAC TROMETHAMINE 30 MG/ML IJ SOLN
30.0000 mg | Freq: Once | INTRAMUSCULAR | Status: AC
  Administered 2017-05-15: 30 mg via INTRAMUSCULAR

## 2017-05-15 NOTE — Progress Notes (Signed)
Subjective:    I'm seeing this patient as a consultation for: Tandy Gaw, PA-C  CC: Severe low back pain  HPI: For the past couple of days this pleasant 42 year old female has had axial low back pain, radiation to the right leg with numbness and tingling, no loss of bowel function bladder function, no trauma, no constitutional symptoms, this is happened several times in the past.  She did take some Flexeril which was minimally efficacious, NSAIDs also minimally efficacious.  I reviewed the past medical history, family history, social history, surgical history, and allergies today and no changes were needed.  Please see the problem list section below in epic for further details.  Past Medical History: Past Medical History:  Diagnosis Date  . AV nodal re-entry tachycardia (HCC)   . Hypertension    Past Surgical History: Past Surgical History:  Procedure Laterality Date  . ABLATION  2002, 2009   cardiac  . LAPAROSCOPIC APPENDECTOMY N/A 12/24/2014   Procedure: APPENDECTOMY LAPAROSCOPIC;  Surgeon: Gaynelle Adu, MD;  Location: [facility];  Service: General;  Laterality: N/A;   Social History: Social History   Socioeconomic History  . Marital status: Married    Spouse name: Not on file  . Number of children: Not on file  . Years of education: Not on file  . Highest education level: Not on file  Occupational History  . Occupation: Charity fundraiser  Social Needs  . Financial resource strain: Not on file  . Food insecurity:    Worry: Not on file    Inability: Not on file  . Transportation needs:    Medical: Not on file    Non-medical: Not on file  Tobacco Use  . Smoking status: Former Games developer  . Smokeless tobacco: Never Used  Substance and Sexual Activity  . Alcohol use: Yes    Alcohol/week: 0.0 oz    Comment: socially  . Drug use: No  . Sexual activity: Yes    Partners: Male  Lifestyle  . Physical activity:    Days per week: Not on file    Minutes per session: Not on file  .  Stress: Not on file  Relationships  . Social connections:    Talks on phone: Not on file    Gets together: Not on file    Attends religious service: Not on file    Active member of club or organization: Not on file    Attends meetings of clubs or organizations: Not on file    Relationship status: Not on file  Other Topics Concern  . Not on file  Social History Narrative  . Not on file   Family History: Family History  Problem Relation Age of Onset  . Hypertension Father   . Hyperlipidemia Father   . Hypertension Brother   . Hyperlipidemia Brother   . Heart attack Maternal Grandmother   . Hyperlipidemia Maternal Grandmother   . Multiple sclerosis Maternal Grandmother   . Fibromyalgia Maternal Grandmother   . Alcohol abuse Paternal Grandmother   . Heart attack Paternal Grandfather   . Hypertension Paternal Grandfather   . Hyperlipidemia Paternal Grandfather   . Stroke Paternal Grandfather    Allergies: Allergies  Allergen Reactions  . Other     Any medication that increases heart rate: pt has re-entry arrhythmia.   Medications: See med rec.  Review of Systems: No headache, visual changes, nausea, vomiting, diarrhea, constipation, dizziness, abdominal pain, skin rash, fevers, chills, night sweats, weight loss, swollen lymph nodes, body aches, joint  swelling, muscle aches, chest pain, shortness of breath, mood changes, visual or auditory hallucinations.   Objective:   General: Well Developed, well nourished, and in no acute distress.  Neuro:  Extra-ocular muscles intact, able to move all 4 extremities, sensation grossly intact.  Deep tendon reflexes tested were normal. Psych: Alert and oriented, mood congruent with affect. ENT:  Ears and nose appear unremarkable.  Hearing grossly normal. Neck: Unremarkable overall appearance, trachea midline.  No visible thyroid enlargement. Eyes: Conjunctivae and lids appear unremarkable.  Pupils equal and round. Skin: Warm and dry, no  rashes noted.  Cardiovascular: Pulses palpable, no extremity edema. Back Exam:  Inspection: Unremarkable  Motion: Flexion 45 deg, Extension 45 deg, Side Bending to 45 deg bilaterally,  Rotation to 45 deg bilaterally  SLR laying: Positive XSLR laying: Positive Palpable tenderness: None. FABER: negative. Sensory change: Gross sensation intact to all lumbar and sacral dermatomes.  Reflexes: 2+ at both patellar tendons, 2+ at achilles tendons, Babinski's downgoing.  Strength at foot  Plantar-flexion: 4/5 on the right, 5/5 on the left dorsi-flexion: 5/5 Eversion: 5/5 Inversion: 5/5  Leg strength  Quad: 5/5 Hamstring: 5/5 Hip flexor: 5/5 Hip abductors: 5/5  Gait unremarkable.  Impression and Recommendations:   This case required medical decision making of moderate complexity.  Right lumbar radiculopathy Right L4 and L5 distribution radiculopathy with slight weakness to dorsiflexion on the right but not overt foot drop. Severe axial discogenic back pain, adding Toradol and Solu-Medrol intramuscular. 5 days of prednisone, Flexeril, formal physical therapy, baseline x-rays. Return to see me in 4-6 weeks, MR for interventional planning if no better.  ___________________________________________ Ihor Austinhomas J. Benjamin Stainhekkekandam, M.D., ABFM., CAQSM. Primary Care and Sports Medicine [facility]  Adjunct Instructor of Family Medicine  [employer]

## 2017-05-15 NOTE — Assessment & Plan Note (Signed)
Right L4 and L5 distribution radiculopathy with slight weakness to dorsiflexion on the right but not overt foot drop. Severe axial discogenic back pain, adding Toradol and Solu-Medrol intramuscular. 5 days of prednisone, Flexeril, formal physical therapy, baseline x-rays. Return to see me in 4-6 weeks, MR for interventional planning if no better.

## 2017-05-22 ENCOUNTER — Encounter: Payer: Self-pay | Admitting: Physician Assistant

## 2017-05-23 NOTE — Telephone Encounter (Signed)
Please order quantiferon TB testing for patients college admission requirements. I told her I would fill her paperwork and sign when I get back from vacation.

## 2017-05-23 NOTE — Addendum Note (Signed)
Addended by: Collie SiadICHARDSON, Kelechi Orgeron M on: 05/23/2017 11:03 AM   Modules accepted: Orders

## 2017-05-23 NOTE — Telephone Encounter (Signed)
Lab ordered.

## 2017-06-02 ENCOUNTER — Other Ambulatory Visit: Payer: Self-pay | Admitting: Physician Assistant

## 2017-06-02 ENCOUNTER — Encounter: Payer: Self-pay | Admitting: Rehabilitative and Restorative Service Providers"

## 2017-06-02 ENCOUNTER — Ambulatory Visit (INDEPENDENT_AMBULATORY_CARE_PROVIDER_SITE_OTHER): Payer: 59 | Admitting: Rehabilitative and Restorative Service Providers"

## 2017-06-02 DIAGNOSIS — M5416 Radiculopathy, lumbar region: Secondary | ICD-10-CM | POA: Diagnosis not present

## 2017-06-02 DIAGNOSIS — R29898 Other symptoms and signs involving the musculoskeletal system: Secondary | ICD-10-CM

## 2017-06-02 DIAGNOSIS — M6281 Muscle weakness (generalized): Secondary | ICD-10-CM | POA: Diagnosis not present

## 2017-06-02 NOTE — Patient Instructions (Signed)
Trunk: Prone Extension (Press-Ups)   No pain!  Lie on stomach on firm, flat surface. Relax bottom and legs. Raise chest in air with elbows straight. Keep hips flat on surface, sag stomach. Hold __1-2__ seconds. Repeat _10___ times. Do _2-3___ sessions per day. CAUTION: Movement should be gentle and slow.   HIP: Hamstrings - Supine  Place strap around foot. Raise leg up, keeping knee straight.  Bend opposite knee to protect back if indicated. Hold 30 seconds. 3 reps per set, 2-3 sets per day    Piriformis Stretch   Lying on back, pull right knee toward opposite shoulder. Hold 30 seconds. Repeat 3 times. Do 2-3 sessions per day.    Calf Stretch    Place hands on wall at shoulder height. Keeping back leg straight, bend front leg, feet pointing forward, heels flat on floor. Lean forward slightly until stretch is felt in calf of back leg. Hold stretch ___ seconds, breathing slowly in and out. Repeat stretch with other leg back. Do ___ sessions per day. Variation: Use chair or table for support.   Abdominal Bracing With Pelvic Floor (Hook-Lying)    With neutral spine, tighten pelvic floor and abdominals sucking belly button to back bone; tighten muscles in low back at waist. Hold 10 sec  Repeat _10__ times. Do __several_ times a day. Progress to do this in sitting; standing and walking with functional activities    Sleeping on Back  Place pillow under knees. A pillow with cervical support and a roll around waist are also helpful. Copyright  VHI. All rights reserved.  Sleeping on Side Place pillow between knees. Use cervical support under neck and a roll around waist as needed. Copyright  VHI. All rights reserved.   Sleeping on Stomach   If this is the only desirable sleeping position, place pillow under lower legs, and under stomach or chest as needed.  Posture - Sitting   Sit upright, head facing forward. Try using a roll to support lower back. Keep shoulders  relaxed, and avoid rounded back. Keep hips level with knees. Avoid crossing legs for long periods. Stand to Sit / Sit to Stand   To sit: Bend knees to lower self onto front edge of chair, then scoot back on seat. To stand: Reverse sequence by placing one foot forward, and scoot to front of seat. Use rocking motion to stand up.   Work Height and Reach  Ideal work height is no more than 2 to 4 inches below elbow level when standing, and at elbow level when sitting. Reaching should be limited to arm's length, with elbows slightly bent.  Bending  Bend at hips and knees, not back. Keep feet shoulder-width apart.    Posture - Standing   Good posture is important. Avoid slouching and forward head thrust. Maintain curve in low back and align ears over shoul- ders, hips over ankles.  Alternating Positions   Alternate tasks and change positions frequently to reduce fatigue and muscle tension. Take rest breaks. Computer Work   Position work to Art gallery manager. Use proper work and seat height. Keep shoulders back and down, wrists straight, and elbows at right angles. Use chair that provides full back support. Add footrest and lumbar roll as needed.  Getting Into / Out of Car  Lower self onto seat, scoot back, then bring in one leg at a time. Reverse sequence to get out.  Dressing  Lie on back to pull socks or slacks over feet, or sit and bend leg  while keeping back straight.    Housework - Sink  Place one foot on ledge of cabinet under sink when standing at sink for prolonged periods.   Pushing / Pulling  Pushing is preferable to pulling. Keep back in proper alignment, and use leg muscles to do the work.  Deep Squat   Squat and lift with both arms held against upper trunk. Tighten stomach muscles without holding breath. Use smooth movements to avoid jerking.  Avoid Twisting   Avoid twisting or bending back. Pivot around using foot movements, and bend at knees if needed when  reaching for articles.  Carrying Luggage   Distribute weight evenly on both sides. Use a cart whenever possible. Do not twist trunk. Move body as a unit.   Lifting Principles .Maintain proper posture and head alignment. .Slide object as close as possible before lifting. .Move obstacles out of the way. .Test before lifting; ask for help if too heavy. .Tighten stomach muscles without holding breath. .Use smooth movements; do not jerk. .Use legs to do the work, and pivot with feet. .Distribute the work load symmetrically and close to the center of trunk. .Push instead of pull whenever possible.   Ask For Help   Ask for help and delegate to others when possible. Coordinate your movements when lifting together, and maintain the low back curve.  Log Roll   Lying on back, bend left knee and place left arm across chest. Roll all in one movement to the right. Reverse to roll to the left. Always move as one unit. Housework - Sweeping  Use long-handled equipment to avoid stooping.   Housework - Wiping  Position yourself as close as possible to reach work surface. Avoid straining your back.  Laundry - Unloading Wash   To unload small items at bottom of washer, lift leg opposite to arm being used to reach.  Gardening - Raking  Move close to area to be raked. Use arm movements to do the work. Keep back straight and avoid twisting.     Cart  When reaching into cart with one arm, lift opposite leg to keep back straight.   Getting Into / Out of Bed  Lower self to lie down on one side by raising legs and lowering head at the same time. Use arms to assist moving without twisting. Bend both knees to roll onto back if desired. To sit up, start from lying on side, and use same move-ments in reverse. Housework - Vacuuming  Hold the vacuum with arm held at side. Step back and forth to move it, keeping head up. Avoid twisting.   Laundry - Armed forces training and education officer so  that bending and twisting can be avoided.   Laundry - Unloading Dryer  Squat down to reach into clothes dryer or use a reacher.  Gardening - Weeding / Psychiatric nurse or Kneel. Knee pads may be helpful.                   TENS UNIT: This is helpful for muscle pain and spasm.   Search and Purchase a TENS 7000 2nd edition at www.tenspros.com. It should be less than $30.     TENS unit instructions: Do not shower or bathe with the unit on Turn the unit off before removing electrodes or batteries If the electrodes lose stickiness add a drop of water to the electrodes after they are disconnected from the unit and place on plastic sheet. If you continued to have difficulty,  call the TENS unit company to purchase more electrodes. Do not apply lotion on the skin area prior to use. Make sure the skin is clean and dry as this will help prolong the life of the electrodes. After use, always check skin for unusual red areas, rash or other skin difficulties. If there are any skin problems, does not apply electrodes to the same area. Never remove the electrodes from the unit by pulling the wires. Do not use the TENS unit or electrodes other than as directed. Do not change electrode placement without consultating your therapist or physician. Keep 2 fingers with between each electrode.

## 2017-06-02 NOTE — Therapy (Addendum)
[facility] [place] [address] [place], [place], [address] Phone: [phone]   Fax:  [phone]  Physical Therapy Evaluation  Patient Details  Name: Stephanie Rush MRN: [id number] Date of Birth: Jul 11, 1975 Referring Provider: Dr Benjamin Stain    Encounter Date: 06/02/2017  PT End of Session - 06/02/17 1116    Visit Number  1    Number of Visits  12    Date for PT Re-Evaluation  07/14/17    PT Start Time  0822    PT Stop Time  0909    PT Time Calculation (min)  47 min    Activity Tolerance  Patient tolerated treatment well       Past Medical History:  Diagnosis Date  . AV nodal re-entry tachycardia (HCC)   . Hypertension     Past Surgical History:  Procedure Laterality Date  . ABLATION  2002, 2009   cardiac  . LAPAROSCOPIC APPENDECTOMY N/A 12/24/2014   Procedure: APPENDECTOMY LAPAROSCOPIC;  Surgeon: Gaynelle Adu, MD;  Location: [facility];  Service: General;  Laterality: N/A;    There were no vitals filed for this visit.   Subjective Assessment - 06/02/17 0816    Subjective  Patient reports LBP starting on 05/12/17 when she bent forward and heard a pop. She has severe pain for several days. She has experienced 3 other episodes in the past three years. She has epsiodes when bent forward and has always felt a pop followed pain and limited functional activities.     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  Yes    Pain Score  3     Pain Location  Back    Pain Orientation  Right;Lower    Pain Descriptors / Indicators  Nagging    Pain Type  Acute pain;Chronic pain    Pain Radiating Towards  Rt posterior hip to lateral thigh     Pain Onset  1 to 4 weeks ago flare up  yesterday     Pain Frequency  Intermittent    Aggravating Factors   bending forward; prolonged standing; lifting; turning in bed     Pain Relieving Factors  standing; flexril and motrin; lying flat on a firm mattress          [facility] PT Assessment - 06/02/17 0001      Assessment   Medical Diagnosis  Rt lumbar radiculopathy     Referring Provider  Dr Benjamin Stain     Onset Date/Surgical Date  05/12/17    Hand Dominance  Right    Next MD Visit  PRN     Prior Therapy  none       Precautions   Precautions  None      Restrictions   Weight Bearing Restrictions  No      Balance Screen   Has the patient fallen in the past 6 months  No    Has the patient had a decrease in activity level because of a fear of falling?   No    Is the patient reluctant to leave their home because of a fear of falling?   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     Leisure  household chores; children's sports       Observation/Other Assessments   Focus on Therapeutic Outcomes (FOTO)   61% limitation       Sensation   Additional Comments  WFL's per pt reports - had one episode of tingling and burning Rt LE with last episopde resolved       Posture/Postural Control   Posture Comments  head forward; shoudlers rounded and elevated; flexed forward at hips       AROM   Lumbar Flexion  80%    Lumbar Extension  45% pain Rt LB    Lumbar - Right Side Bend  55% pain Rt LB     Lumbar - Left Side Bend  60%    Lumbar - Right Rotation  55%    Lumbar - Left Rotation  55%      Strength   Right/Left Hip  -- 5/5 hips     Right/Left Knee  -- 5/5 bilat LE'e    Right/Left Ankle  -- 5/5 bilat LE's except Lt PF 5-/5       Flexibility   Hamstrings  tight ~ 70-75 deg bilat    Quadriceps  minimal tightness    ITB   minimal tightness    Piriformis  tight Lt > Rt       Palpation   Spinal mobility  hypomobile lumbar spine with CPA and lateral mobs     SI assessment   equal in standing     Palpation comment  tight Rt lumbar paraspinals; QL; into Rt posterior hip/piriformis musculature                 Objective measurements completed on examination: See above findings.      [facility] Adult PT Treatment/Exercise - 06/02/17 0001      Therapeutic Activites    Therapeutic Activities  -- initiated back care education       Lumbar Exercises: Stretches   Passive Hamstring Stretch  Right;Left;2 reps;30 seconds supine with strap     Piriformis Stretch  Right;Left;2 reps;30 seconds supine travell - Lt tighter than Rt     Gastroc Stretch  Left;3 reps;30 seconds instructed in stretch       Lumbar Exercises: Supine   Other Supine Lumbar Exercises  3 part core 10 sec x 10 with verbal and tactile cues       Moist Heat Therapy   Number Minutes Moist Heat  20 Minutes    Moist Heat Location  Lumbar Spine;Hip      Electrical Stimulation   Electrical Stimulation Location  Rt lumbar to posterior hip area     Electrical Stimulation Action  IFC    Electrical Stimulation Parameters  to tolerance    Electrical Stimulation Goals  Pain;Tone             PT Education - 06/02/17 0844    Education provided  Yes    Education Details  HEP back care TENS     Person(s) Educated  Patient    Methods  Explanation;Demonstration;Tactile cues;Handout;Verbal cues    Comprehension  Verbalized understanding;Returned demonstration;Verbal cues required;Tactile cues required          PT Long Term Goals - 06/02/17 1122  PT LONG TERM GOAL #1   Title  Improve back care knowledge with patient to demonstrate good body mechanics for all transfers and transitional movements 07/14/17    Time  6    Period  Weeks    Status  New      PT LONG TERM GOAL #2   Title  Improve core strength and stability with patient to  perform 20-30 minutes of exercise with good form and technique 07/14/17    Time  6    Period  Weeks      PT LONG TERM GOAL #3   Title  Decrease pain with patient to report no episodes of catching or pain with functional reaching or bending 07/14/17    Time  6    Period  Weeks    Status  New      PT LONG TERM GOAL #4   Title  Independent in HEP 07/14/17    Time  6    Period  Weeks      PT LONG TERM GOAL #5   Title  Improve FOTO to </= 39% limitation 07/14/17    Time  6    Period  Weeks    Status  New             Plan - 06/02/17 1116    Clinical Impression Statement  Stephanie Rush presents s/p sudden onset of LBP 05/12/17 when she bent over to pick something up from the floor. Patient has a history of this type pain occurring ~ 3 times in the past 3 years and resolving with rest and medication. Patient has poor core stability; limited trunk ROM; poor body mechanics. She has muscular tightness to palpation through the lumbar and Rt > Lt posterior hip musculture. Patient will benefit from PT to address problems identified.     History and Personal Factors relevant to plan of care:  sedentary lifestyle; history of recurrent back pain; Lt calf tightness over the past couple of years arter spending prolonged time on toes watching daughter    Clinical Presentation  Stable    Clinical Decision Making  Low    Rehab Potential  Good    PT Frequency  2x / week    PT Duration  6 weeks    PT Treatment/Interventions  Patient/family education;ADLs/Self Care Home Management;Cryotherapy;Electrical Stimulation;Iontophoresis /ml Dexamethasone;Moist Heat;Ultrasound;Dry needling;Manual techniques;Neuromuscular re-education;Therapeutic activities;Therapeutic exercise    PT Next Visit Plan  review HEP; deep tissue work in area of muscular tightness; progress with core stabilization and strengthening; modalities as indicated     Consulted and Agree with Plan of Care  Patient       Patient will benefit from  skilled therapeutic intervention in order to improve the following deficits and impairments:  Postural dysfunction, Improper body mechanics, Pain, Increased fascial restricitons, Increased muscle spasms, Hypomobility, Decreased mobility, Decreased range of motion, Decreased activity tolerance  Visit Diagnosis: Radiculopathy, lumbar region - Plan: PT plan of care cert/re-cert  Muscle weakness (generalized) - Plan: PT plan of care cert/re-cert  Other symptoms and signs involving the musculoskeletal system - Plan: PT plan of care cert/re-cert     Problem List Patient Active Problem List   Diagnosis Date Noted  . Right lumbar radiculopathy 05/15/2017  . Hyperlipidemia 01/05/2016  . GAD (generalized anxiety disorder) 01/06/2015  . Elevated liver enzymes 01/06/2015  . Appendicitis 12/24/2014  . Essential hypertension, benign 09/25/2013  . Depression 07/21/2013  . Anxiety state, unspecified 07/21/2013  . AV nodal re-entry tachycardia (HCC) 07/21/2013  .  Class 1 obesity due to excess calories without serious comorbidity with body mass index (BMI) of 31.0 to 31.9 in adult 07/21/2013  . Abnormal weight gain 07/21/2013    Celyn Rober Minion PT, MPH  06/02/2017, 11:38 AM  [facility] [address] [place], [place], [address] Phone: [phone]   Fax:  [phone]  Name: Stephanie Rush MRN: [id number] Date of Birth: [birth year]

## 2017-06-05 ENCOUNTER — Ambulatory Visit (INDEPENDENT_AMBULATORY_CARE_PROVIDER_SITE_OTHER): Payer: 59 | Admitting: Rehabilitative and Restorative Service Providers"

## 2017-06-05 DIAGNOSIS — R29898 Other symptoms and signs involving the musculoskeletal system: Secondary | ICD-10-CM

## 2017-06-05 DIAGNOSIS — M5416 Radiculopathy, lumbar region: Secondary | ICD-10-CM

## 2017-06-05 DIAGNOSIS — M6281 Muscle weakness (generalized): Secondary | ICD-10-CM

## 2017-06-05 NOTE — Therapy (Signed)
[facility] [place] [address] [place], [place], [address] Phone: [phone]   Fax:  [phone]  Physical Therapy Treatment  Patient Details  Name: NARYAH CLENNEY MRN: [id number] Date of Birth: 09/11/1975 Referring Provider: Dr Benjamin Stain    Encounter Date: 06/05/2017  PT End of Session - 06/05/17 0801    Visit Number  2    Number of Visits  12    Date for PT Re-Evaluation  07/14/17    PT Start Time  0801    PT Stop Time  0854    PT Time Calculation (min)  53 min    Activity Tolerance  Patient tolerated treatment well       Past Medical History:  Diagnosis Date  . AV nodal re-entry tachycardia (HCC)   . Hypertension     Past Surgical History:  Procedure Laterality Date  . ABLATION  2002, 2009   cardiac  . LAPAROSCOPIC APPENDECTOMY N/A 12/24/2014   Procedure: APPENDECTOMY LAPAROSCOPIC;  Surgeon: Gaynelle Adu, MD;  Location: [facility];  Service: General;  Laterality: N/A;    There were no vitals filed for this visit.  Subjective Assessment - 06/05/17 0803    Subjective  Patient reports that she is feling better. She iis no longer having radiating symptoms. She continues to have some pain in the Rt LB area.     Currently in Pain?  Yes    Pain Score  1     Pain Location  Back    Pain Orientation  Right;Lower    Pain Descriptors / Indicators  Nagging;Dull    Pain Type  Acute pain                       [facility] Adult PT Treatment/Exercise - 06/05/17 0001      Self-Care   Self-Care  -- sitting position ed - trial oon exercise ball       Therapeutic Activites    Therapeutic Activities  -- myofacial ball release Rt posterior hip       Lumbar Exercises: Stretches   Passive Hamstring Stretch  Right;Left;2 reps;30 seconds supine with strap     Press Ups  -- 3-4 sec x 10     Piriformis Stretch  Right;Left;30 seconds;3 reps supine travell - Lt tighter than Rt     Piriformis Stretch Limitations  varying  angles diagonal knee to chest     Gastroc Stretch  Left;3 reps;30 seconds instructed in stretch       Lumbar Exercises: Aerobic   Tread Mill  2.0 to 2.3 mph x 5 min       Lumbar Exercises: Standing   Wall Slides  10 reps core engaged      Lumbar Exercises: Seated   Sit to Stand  10 reps core engaged    Other Seated Lumbar Exercises  sitting on large exercises ball       Lumbar Exercises: Supine   Bent Knee Raise  10 reps    Dead Bug  10 reps    Other Supine Lumbar Exercises  3 part core 10 sec x 10 with verbal and tactile cues       Moist Heat Therapy   Number Minutes Moist Heat  20 Minutes    Moist Heat Location  Lumbar Spine;Hip      Electrical Stimulation   Electrical Stimulation Location  Rt lumbar to posterior hip area     Electrical Stimulation Action  IFC  Electrical Stimulation Parameters  to tolerance    Electrical Stimulation Goals  Pain;Tone      Manual Therapy   Manual therapy comments  patient prone     Joint Mobilization  lumbar CPA mobs Grade II/III    Soft tissue mobilization  deep tissue work through the Rt piriformis/hip abductors     Myofascial Release  posterior Rt hip              PT Education - 06/05/17 0829    Education provided  Yes    Education Details  HEP     Person(s) Educated  Patient    Methods  Explanation;Demonstration;Tactile cues;Verbal cues;Handout    Comprehension  Verbalized understanding;Returned demonstration;Verbal cues required;Tactile cues required          PT Long Term Goals - 06/05/17 0802      PT LONG TERM GOAL #1   Title  Improve back care knowledge with patient to demonstrate good body mechanics for all transfers and transitional movements 07/14/17    Time  6    Period  Weeks    Status  On-going      PT LONG TERM GOAL #2   Title  Improve core strength and stability with patient to perform 20-30 minutes of exercise with good form and technique 07/14/17    Time  6    Period  Weeks    Status  On-going       PT LONG TERM GOAL #3   Title  Decrease pain with patient to report no episodes of catching or pain with functional reaching or bending 07/14/17    Time  6    Period  Weeks    Status  On-going      PT LONG TERM GOAL #4   Title  Independent in HEP 07/14/17    Time  6    Period  Weeks    Status  On-going      PT LONG TERM GOAL #5   Title  Improve FOTO to </= 39% limitation 07/14/17    Time  6    Period  Weeks    Status  On-going            Plan - 06/05/17 0844    Clinical Impression Statement  Excellent response to initial treatment and HEP. Patient reports full resolution of Rt LE symptoms. She has some continued Rt posterior hip pain but this responds well to pirifromis stretch - added varying angles for this stretch today. Patient added core stabilization exercises without difficulty.  Progressing well toward stated goals of therapy. No goals accomplished - only second visit.     Rehab Potential  Good    PT Frequency  2x / week    PT Duration  6 weeks    PT Treatment/Interventions  Patient/family education;ADLs/Self Care Home Management;Cryotherapy;Electrical Stimulation;Iontophoresis /ml Dexamethasone;Moist Heat;Ultrasound;Dry needling;Manual techniques;Neuromuscular re-education;Therapeutic activities;Therapeutic exercise    PT Next Visit Plan  review HEP; deep tissue work in area of muscular tightness; progress with core stabilization and strengthening; modalities as indicated     Consulted and Agree with Plan of Care  Patient       Patient will benefit from skilled therapeutic intervention in order to improve the following deficits and impairments:  Postural dysfunction, Improper body mechanics, Pain, Increased fascial restricitons, Increased muscle spasms, Hypomobility, Decreased mobility, Decreased range of motion, Decreased activity tolerance  Visit Diagnosis: Radiculopathy, lumbar region  Muscle weakness (generalized)  Other symptoms and signs involving the  musculoskeletal  system     Problem List Patient Active Problem List   Diagnosis Date Noted  . Right lumbar radiculopathy 05/15/2017  . Hyperlipidemia 01/05/2016  . GAD (generalized anxiety disorder) 01/06/2015  . Elevated liver enzymes 01/06/2015  . Appendicitis 12/24/2014  . Essential hypertension, benign 09/25/2013  . Depression 07/21/2013  . Anxiety state, unspecified 07/21/2013  . AV nodal re-entry tachycardia (HCC) 07/21/2013  . Class 1 obesity due to excess calories without serious comorbidity with body mass index (BMI) of 31.0 to 31.9 in adult 07/21/2013  . Abnormal weight gain 07/21/2013    Lien Lyman Rober Minion PT, MPH  06/05/2017, 8:48 AM  [facility] [address] [place], [place], [address] Phone: [phone]   Fax:  [phone]  Name: FRANCESCA STROME MRN: [id number] Date of Birth: 11/22/1975

## 2017-06-05 NOTE — Patient Instructions (Addendum)
Combination (Hook-Lying)    Tighten stomach and slowly raise left leg and lower opposite arm over head. Keep trunk rigid. Repeat __10__ times per set. Do __2-3__ sets per session. Do ___1_ sessions per day.  SIT TO STAND: No Device   Tighten core  Sit with feet shoulder-width apart, on floor. Lean chest forward, raise hips up from surface. Straighten hips and knees. Weight bear equally on left and right sides. _10__ reps per set, _2-3__ sets per day, _1-2__ days per day  Back Wall Slide    With feet __10-12__ inches from wall, lean as much of back against the wall as possible. Gently squat down _10-12__ inches, keeping back against wall. Hold __10-20__ seconds while counting out loud. Repeat _10___ times. Do __1-2__ sessions per day.

## 2017-06-09 DIAGNOSIS — H5213 Myopia, bilateral: Secondary | ICD-10-CM | POA: Diagnosis not present

## 2017-06-10 ENCOUNTER — Ambulatory Visit (INDEPENDENT_AMBULATORY_CARE_PROVIDER_SITE_OTHER): Payer: 59 | Admitting: Physical Therapy

## 2017-06-10 DIAGNOSIS — M5416 Radiculopathy, lumbar region: Secondary | ICD-10-CM | POA: Diagnosis not present

## 2017-06-10 DIAGNOSIS — M6281 Muscle weakness (generalized): Secondary | ICD-10-CM | POA: Diagnosis not present

## 2017-06-10 DIAGNOSIS — R29898 Other symptoms and signs involving the musculoskeletal system: Secondary | ICD-10-CM | POA: Diagnosis not present

## 2017-06-10 NOTE — Therapy (Addendum)
[place], [place], [address] Phone: [phone]   Fax:  [phone]  Physical Therapy Treatment  Patient Details  Name: Stephanie Rush MRN: [id number] Date of Birth: 1975/04/19 Referring Provider: Dr. Dianah Field   Encounter Date: 06/10/2017  PT End of Session - 06/10/17 1015    Visit Number  3    Number of Visits  12    Date for PT Re-Evaluation  07/14/17    PT Start Time  0934    PT Stop Time  1029    PT Time Calculation (min)  55 min    Activity Tolerance  Patient tolerated treatment well;No increased pain    Behavior During Therapy  WFL for tasks assessed/performed       Past Medical History:  Diagnosis Date  . AV nodal re-entry tachycardia ([place])   . Hypertension     Past Surgical History:  Procedure Laterality Date  . ABLATION  2002, 2009   cardiac  . LAPAROSCOPIC APPENDECTOMY N/A 12/24/2014   Procedure: APPENDECTOMY LAPAROSCOPIC;  Surgeon: Greer Pickerel, MD;  Location: [facility];  Service: General;  Laterality: N/A;    There were no vitals filed for this visit.  Subjective Assessment - 06/10/17 0936    Subjective  "I'm feeling much better".   She is working on exercises 2x/day.    She hasn't had radicular symptoms in over a week.     Patient Stated Goals  get back stronger so she will not have repeated episodes of severe LBP     Currently in Pain?  No/denies    Pain Score  0-No pain         [facility] PT Assessment - 06/10/17 0001      Assessment   Medical Diagnosis  Rt lumbar radiculopathy     Referring Provider  Dr. Dianah Field    Onset Date/Surgical Date  05/12/17    Hand Dominance  Right    Next MD Visit  PRN        [facility] Adult PT Treatment/Exercise - 06/10/17 0001      Lumbar Exercises: Stretches   Passive Hamstring Stretch  Right;Left;2 reps;30 seconds;Limitations supine with strap     Passive Hamstring Stretch Limitations   seated version for when sitting at desk.      Standing Extension  3 reps;5 seconds    Press Ups  --    Piriformis Stretch  Right;Left;30 seconds;3 reps supine travell - Lt tighter than Rt     Piriformis Stretch Limitations  varying angles diagonal knee to chest     Figure 4 Stretch  2 reps;20 seconds;With overpressure      Lumbar Exercises: Aerobic   Tread Mill  2.2 to 2.4 mph x 5.5 min       Lumbar Exercises: Seated   Sit to Stand  5 reps core engaged    Other Seated Lumbar Exercises  lap press with core engaged x 5 sec, 5 reps; seated marching with core engaged x 5 reps.       Lumbar Exercises: Supine   Ab Set  5 seconds;5 reps    Bent Knee Raise  10 reps with ab set    Dead Bug  10 reps with ab set    Bridge  10 reps core engaged    Other Supine Lumbar Exercises  beginner pilates 100, broken into 20 pulses.       Moist Heat Therapy   Number Minutes Moist Heat  15 Minutes  Moist Heat Location  Lumbar Spine;Hip      Electrical Stimulation   Electrical Stimulation Location  Rt lumbar to posterior hip area     Electrical Stimulation Action  IFC    Electrical Stimulation Parameters  to tolerance    Electrical Stimulation Goals  Pain                  PT Long Term Goals - 06/10/17 1000      PT LONG TERM GOAL #1   Title  Improve back care knowledge with patient to demonstrate good body mechanics for all transfers and transitional movements 07/14/17    Time  6    Period  Weeks    Status  On-going      PT LONG TERM GOAL #2   Title  Improve core strength and stability with patient to perform 20-30 minutes of exercise with good form and technique 07/14/17    Time  6    Period  Weeks    Status  On-going      PT LONG TERM GOAL #3   Title  Decrease pain with patient to report no episodes of catching or pain with functional reaching or bending 07/14/17    Time  6    Period  Weeks    Status  On-going      PT LONG TERM GOAL #4   Title  Independent in HEP 07/14/17    Time  6    Period  Weeks    Status   On-going      PT LONG TERM GOAL #5   Title  Improve FOTO to </= 39% limitation 07/14/17    Time  6    Period  Weeks    Status  On-going            Plan - 06/10/17 1001    Clinical Impression Statement  Pt has improved engagement of core muscles. She tolerated all exercises well, without production of symptoms.   Modalities used to prevent flare up of symptoms post-workout.  Progressing towards goals.     Rehab Potential  Good    PT Frequency  2x / week    PT Duration  6 weeks    PT Treatment/Interventions  Patient/family education;ADLs/Self Care Home Management;Cryotherapy;Electrical Stimulation;Iontophoresis 47m/ml Dexamethasone;Moist Heat;Ultrasound;Dry needling;Manual techniques;Neuromuscular re-education;Therapeutic activities;Therapeutic exercise    PT Next Visit Plan  continue to progress spinal stabilization exercises.  Manual therapy and modalities as indicated.     Consulted and Agree with Plan of Care  Patient       Patient will benefit from skilled therapeutic intervention in order to improve the following deficits and impairments:  Postural dysfunction, Improper body mechanics, Pain, Increased fascial restricitons, Increased muscle spasms, Hypomobility, Decreased mobility, Decreased range of motion, Decreased activity tolerance  Visit Diagnosis: Radiculopathy, lumbar region  Muscle weakness (generalized)  Other symptoms and signs involving the musculoskeletal system     Problem List Patient Active Problem List   Diagnosis Date Noted  . Right lumbar radiculopathy 05/15/2017  . Hyperlipidemia 01/05/2016  . GAD (generalized anxiety disorder) 01/06/2015  . Elevated liver enzymes 01/06/2015  . Appendicitis 12/24/2014  . Essential hypertension, benign 09/25/2013  . Depression 07/21/2013  . Anxiety state, unspecified 07/21/2013  . [place] re-[place] tachycardia [place] 07/21/2013  . Class 1 obesity due to excess calories without serious comorbidity with body mass  index (BMI) of 31.0 to 31.9 in adult 07/21/2013  . Abnormal weight gain 07/21/2013   JKerin Perna PTA  06/10/17 10:17 AM  [place], [place], [address] Phone: [phone]   Fax:  [phone]  Name: Stephanie Rush MRN: [id number] Date of Birth: 19-Apr-1975  PHYSICAL THERAPY DISCHARGE SUMMARY  Visits from Start of Care: 3  Current functional level related to goals / functional outcomes: See progress note for discharge status   Remaining deficits: See progress note    Education / Equipment: HEP Plan: Patient agrees to discharge.  Patient goals were partially met. Patient is being discharged due to financial reasons.  ?????     Celyn P. Helene Kelp PT, MPH 06/19/17 5:06 PM

## 2017-06-12 ENCOUNTER — Encounter: Payer: 59 | Admitting: Physical Therapy

## 2017-06-17 ENCOUNTER — Encounter: Payer: 59 | Admitting: Rehabilitative and Restorative Service Providers"

## 2017-06-18 ENCOUNTER — Encounter: Payer: Self-pay | Admitting: Physician Assistant

## 2017-06-19 ENCOUNTER — Encounter: Payer: 59 | Admitting: Rehabilitative and Restorative Service Providers"

## 2017-06-20 ENCOUNTER — Ambulatory Visit (INDEPENDENT_AMBULATORY_CARE_PROVIDER_SITE_OTHER): Payer: 59 | Admitting: Physician Assistant

## 2017-06-20 VITALS — BP 116/66 | HR 62 | Wt 224.0 lb

## 2017-06-20 DIAGNOSIS — Z0000 Encounter for general adult medical examination without abnormal findings: Secondary | ICD-10-CM

## 2017-06-20 LAB — POCT UA - MICROALBUMIN
Albumin/Creatinine Ratio, Urine, POC: <30
CREATININE, POC: 50 mg/dL
MICROALBUMIN (UR) POC: 10 mg/L

## 2017-06-20 LAB — POCT URINALYSIS DIPSTICK
Bilirubin, UA: NEGATIVE
GLUCOSE UA: NEGATIVE
KETONES UA: NEGATIVE
Leukocytes, UA: NEGATIVE
Nitrite, UA: NEGATIVE
Protein, UA: NEGATIVE
Spec Grav, UA: 1.015 (ref 1.010–1.025)
Urobilinogen, UA: 0.2 E.U./dL
pH, UA: 8 (ref 5.0–8.0)

## 2017-06-20 NOTE — Progress Notes (Signed)
Pt came to office to have health maintenance form completed.  Vitals:   06/20/17 1029  BP: 116/66  Pulse: 62     Pt had normal vision screening, color blindness testing, hearing test, and urine microalbumin.  U/A reflected large amounts of blood due to pt confirming being on her period at time sample was collected.

## 2017-07-11 ENCOUNTER — Encounter: Payer: Self-pay | Admitting: Physician Assistant

## 2017-07-22 MED FILL — buPROPion HCL ER (XL) 150 M: 150 | 90 days supply | Qty: 90 | Fill #2

## 2017-07-22 MED FILL — ESCITALOPRAM 10 MG TABLET: 10 | 90 days supply | Qty: 90 | Fill #2

## 2017-07-22 MED FILL — METOPROLOL SUCCINATE ER 50: 50 | 90 days supply | Qty: 90 | Fill #2 | Status: TO

## 2017-07-28 ENCOUNTER — Encounter: Payer: Self-pay | Admitting: Sports Medicine

## 2017-07-28 MED ORDER — PREDNISONE 50 MG PO TABS: ORAL_TABLET | ORAL | 0 refills | Status: DC

## 2017-08-04 ENCOUNTER — Telehealth: Payer: Self-pay | Admitting: Physician Assistant

## 2017-08-04 DIAGNOSIS — M5416 Radiculopathy, lumbar region: Secondary | ICD-10-CM

## 2017-08-04 MED ORDER — PREDNISONE 10 MG (48) PO TBPK: ORAL_TABLET | Freq: Every day | ORAL | 0 refills | Status: DC

## 2017-08-04 NOTE — Telephone Encounter (Signed)
Thanks

## 2017-08-04 NOTE — Telephone Encounter (Signed)
If the back pain has returned we do need to go ahead and proceed with an MRI, let me know if this is okay and I will place the order.

## 2017-08-04 NOTE — Telephone Encounter (Signed)
Pt called. She has seen you before for back pain and she's still having that issue; she wants to know what is the next step.

## 2017-08-04 NOTE — Telephone Encounter (Signed)
No problem, prednisone taper called in, adding MRI order, return to see me to go over MRI results afterwards.

## 2017-08-04 NOTE — Telephone Encounter (Signed)
Patient notified med sent to pharmacy and to follow up to go over MRI results once done. KG LPN

## 2017-08-04 NOTE — Telephone Encounter (Signed)
Called patient and she is ok with an MRI- would like this scheduled wherever can get her in the fastest- in a lot of pain.  She also asked if you would give her another round of steroids until she gets MRI and figures out wha t is wrong. Please advise. KG LPN

## 2017-08-11 ENCOUNTER — Telehealth: Payer: Self-pay | Admitting: Physician Assistant

## 2017-08-11 ENCOUNTER — Ambulatory Visit (INDEPENDENT_AMBULATORY_CARE_PROVIDER_SITE_OTHER): Payer: 59

## 2017-08-11 DIAGNOSIS — M545 Low back pain: Secondary | ICD-10-CM | POA: Diagnosis not present

## 2017-08-11 DIAGNOSIS — M5116 Intervertebral disc disorders with radiculopathy, lumbar region: Secondary | ICD-10-CM

## 2017-08-11 DIAGNOSIS — M5416 Radiculopathy, lumbar region: Secondary | ICD-10-CM

## 2017-08-11 NOTE — Telephone Encounter (Signed)
Error

## 2017-08-13 ENCOUNTER — Ambulatory Visit (INDEPENDENT_AMBULATORY_CARE_PROVIDER_SITE_OTHER): Payer: 59 | Admitting: Sports Medicine

## 2017-08-13 ENCOUNTER — Encounter: Payer: Self-pay | Admitting: Sports Medicine

## 2017-08-13 DIAGNOSIS — M51369 Other intervertebral disc degeneration, lumbar region without mention of lumbar back pain or lower extremity pain: Secondary | ICD-10-CM

## 2017-08-13 DIAGNOSIS — M5136 Other intervertebral disc degeneration, lumbar region: Secondary | ICD-10-CM

## 2017-08-13 MED ORDER — PREDNISONE 50 MG PO TABS: ORAL_TABLET | ORAL | 0 refills | Status: DC

## 2017-08-13 NOTE — Assessment & Plan Note (Signed)
Continues to do better with a burst of prednisone, formal therapy. Mostly axial discogenic pain, MRI dominant finding is an L4-L5 disc extrusion, there is no foraminal stenosis at any level. I am going to give her an extra burst of prednisone to hold onto, [place] does [place] to [place]. She will also work with Dr. Lyn HollingsheadAlexander on osteopathic manipulation. Return to see me as needed.

## 2017-08-13 NOTE — Progress Notes (Signed)
Subjective:    CC: Follow-up  HPI: Has improved considerably with the burst of prednisone, feels pretty good, she ultimately had an MRI that showed a dominant finding of an L4-L5 disc protrusion.  She does have a trip coming up to [place], and is worried about having a flare of pain while out of country, wondering if she could hold onto some prednisone to take if needed.  I reviewed the past medical history, family history, social history, surgical history, and allergies today and no changes were needed.  Please see the problem list section below in epic for further details.  Past Medical History: Past Medical History:  Diagnosis Date  . AV nodal re-entry tachycardia (HCC)   . Hypertension    Past Surgical History: Past Surgical History:  Procedure Laterality Date  . ABLATION  2002, 2009   cardiac  . LAPAROSCOPIC APPENDECTOMY N/A 12/24/2014   Procedure: APPENDECTOMY LAPAROSCOPIC;  Surgeon: Gaynelle Adu, MD;  Location: [facility];  Service: General;  Laterality: N/A;   Social History: Social History   Socioeconomic History  . Marital status: Married    Spouse name: Not on file  . Number of children: Not on file  . Years of education: Not on file  . Highest education level: Not on file  Occupational History  . Occupation: Charity fundraiser  Social Needs  . Financial resource strain: Not on file  . Food insecurity:    Worry: Not on file    Inability: Not on file  . Transportation needs:    Medical: Not on file    Non-medical: Not on file  Tobacco Use  . Smoking status: Former Games developer  . Smokeless tobacco: Never Used  Substance and Sexual Activity  . Alcohol use: Yes    Alcohol/week: 0.0 oz    Comment: socially  . Drug use: No  . Sexual activity: Yes    Partners: Male  Lifestyle  . Physical activity:    Days per week: Not on file    Minutes per session: Not on file  . Stress: Not on file  Relationships  . Social connections:    Talks on phone: Not on file    Gets together: Not on  file    Attends religious service: Not on file    Active member of club or organization: Not on file    Attends meetings of clubs or organizations: Not on file    Relationship status: Not on file  Other Topics Concern  . Not on file  Social History Narrative  . Not on file   Family History: Family History  Problem Relation Age of Onset  . Hypertension Father   . Hyperlipidemia Father   . Hypertension Brother   . Hyperlipidemia Brother   . Heart attack Maternal Grandmother   . Hyperlipidemia Maternal Grandmother   . Multiple sclerosis Maternal Grandmother   . Fibromyalgia Maternal Grandmother   . Alcohol abuse Paternal Grandmother   . Heart attack Paternal Grandfather   . Hypertension Paternal Grandfather   . Hyperlipidemia Paternal Grandfather   . Stroke Paternal Grandfather    Allergies: Allergies  Allergen Reactions  . Other     Any medication that increases heart rate: pt has re-entry arrhythmia.   Medications: See med rec.  Review of Systems: No fevers, chills, night sweats, weight loss, chest pain, or shortness of breath.   Objective:    General: Well Developed, well nourished, and in no acute distress.  Neuro: Alert and oriented x3, extra-ocular muscles intact, sensation  grossly intact.  HEENT: Normocephalic, atraumatic, pupils equal round reactive to light, neck supple, no masses, no lymphadenopathy, thyroid nonpalpable.  Skin: Warm and dry, no rashes. Cardiac: Regular rate and rhythm, no murmurs rubs or gallops, no lower extremity edema.  Respiratory: Clear to auscultation bilaterally. Not using accessory muscles, speaking in full sentences.  Impression and Recommendations:    Lumbar degenerative disc disease Continues to do better with a burst of prednisone, formal therapy. Mostly axial discogenic pain, MRI dominant finding is an L4-L5 disc extrusion, there is no foraminal stenosis at any level. I am going to give her an extra burst of prednisone to hold  onto, she does have a trip to [place] coming up. She will also work with Dr. Lyn HollingsheadAlexander on osteopathic manipulation. Return to see me as needed.  I spent 25 minutes with this patient, greater than 50% was face-to-face time counseling regarding the above diagnoses ___________________________________________ Ihor Austinhomas J. Benjamin Stainhekkekandam, M.D., ABFM., CAQSM. Primary Care and Sports Medicine [facility]  Adjunct Instructor of Family Medicine  [employer]

## 2017-08-14 ENCOUNTER — Encounter: Payer: Self-pay | Admitting: Sports Medicine

## 2017-08-14 ENCOUNTER — Ambulatory Visit: Payer: 59 | Admitting: Sports Medicine

## 2017-08-14 DIAGNOSIS — M9904 Segmental and somatic dysfunction of sacral region: Secondary | ICD-10-CM | POA: Diagnosis not present

## 2017-08-14 DIAGNOSIS — M5136 Other intervertebral disc degeneration, lumbar region: Secondary | ICD-10-CM

## 2017-08-14 DIAGNOSIS — M545 Low back pain: Secondary | ICD-10-CM | POA: Diagnosis not present

## 2017-08-14 DIAGNOSIS — M51369 Other intervertebral disc degeneration, lumbar region without mention of lumbar back pain or lower extremity pain: Secondary | ICD-10-CM

## 2017-08-14 DIAGNOSIS — M9905 Segmental and somatic dysfunction of pelvic region: Secondary | ICD-10-CM | POA: Diagnosis not present

## 2017-08-18 ENCOUNTER — Ambulatory Visit: Payer: 59 | Admitting: Osteopathic Medicine

## 2017-08-20 IMAGING — CT CT ABD-PELV W/ CM
2 of 4 series · 17 of 46 positions shown, 19 images · IV contrast (OMNIPAQUE 300)
Comparison: None.

CLINICAL DATA: Right lower quadrant pain and tenderness since
yesterday. Nausea.

EXAM:
CT ABDOMEN AND PELVIS WITH CONTRAST
TECHNIQUE: Multidetector CT imaging of the abdomen and pelvis was performed
using the standard protocol following bolus administration of
intravenous contrast.
CONTRAST:  100mL OMNIPAQUE IOHEXOL 300 MG/ML  SOLN

[Series 2: abd/pel with · axial · 0.74mm/px · z∈[-491,-71]mm · 14 of 92 slices shown, 16 images]
[im 4/92  soft-tissue]
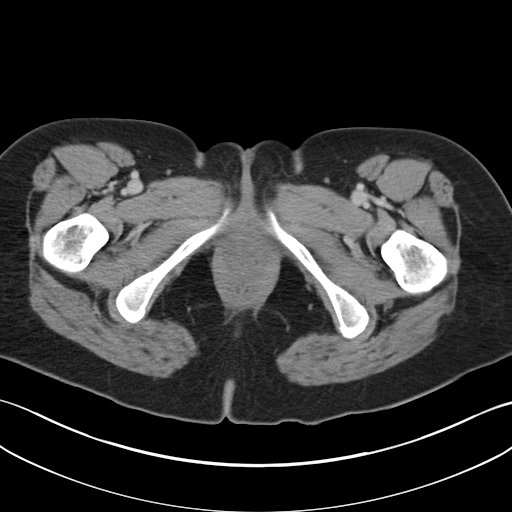
[im 4/92  bone]
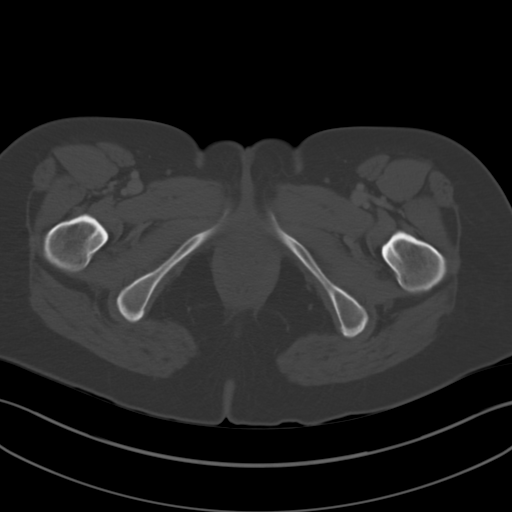
[im 12/92  soft-tissue]
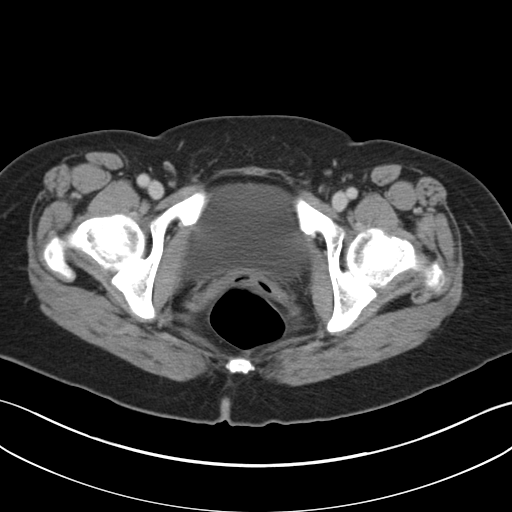
[im 19/92  soft-tissue]
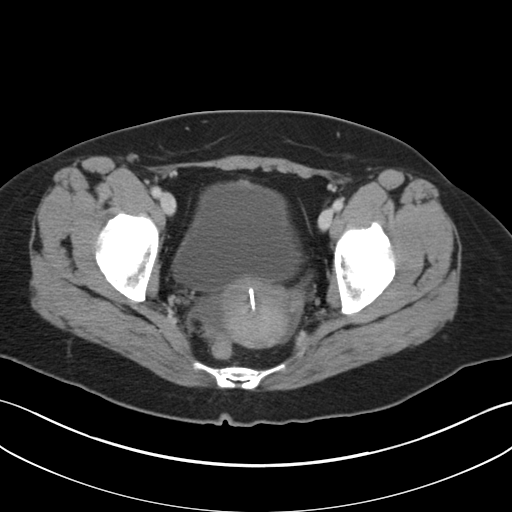
[im 23/92  soft-tissue]
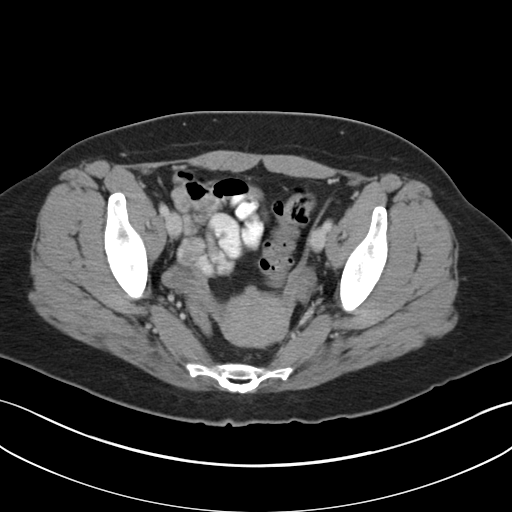
[im 31/92  soft-tissue]
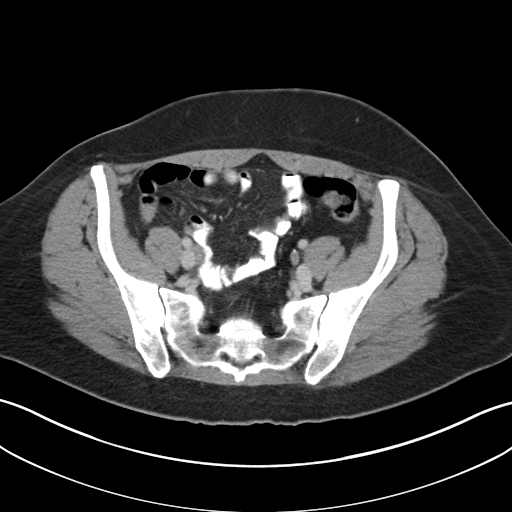
[im 38/92  soft-tissue]
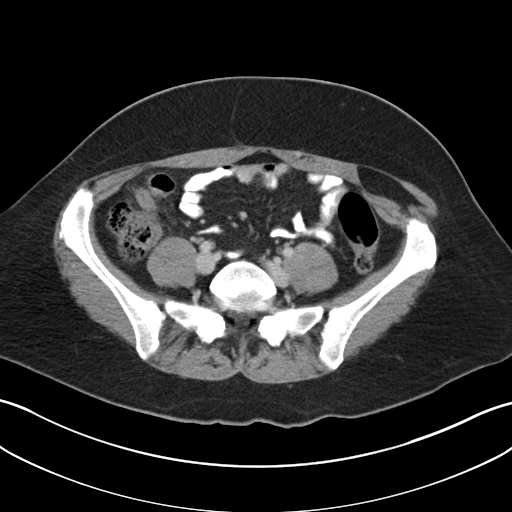
[im 42/92  soft-tissue]
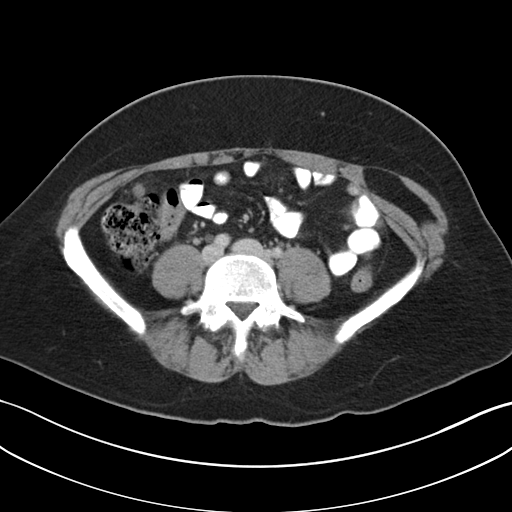
[im 50/92  soft-tissue]
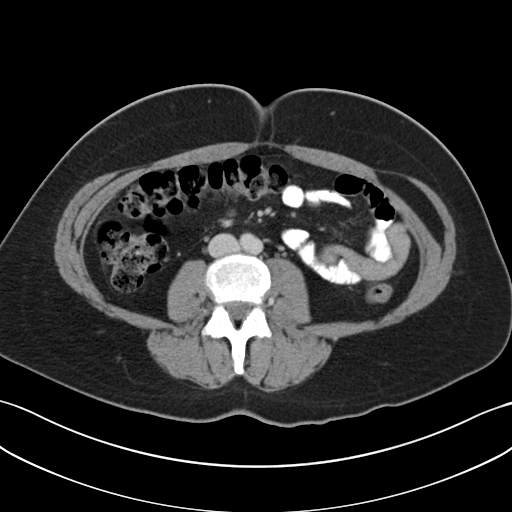
[im 54/92  soft-tissue]
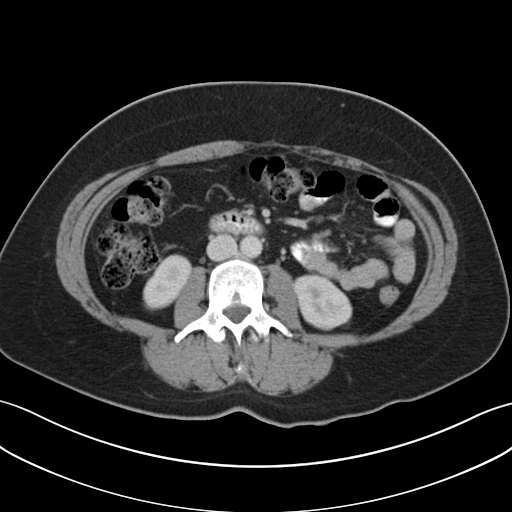
[im 54/92  bone]
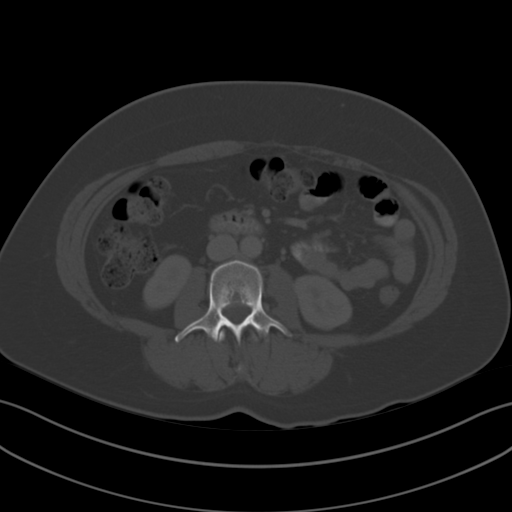
[im 61/92  soft-tissue]
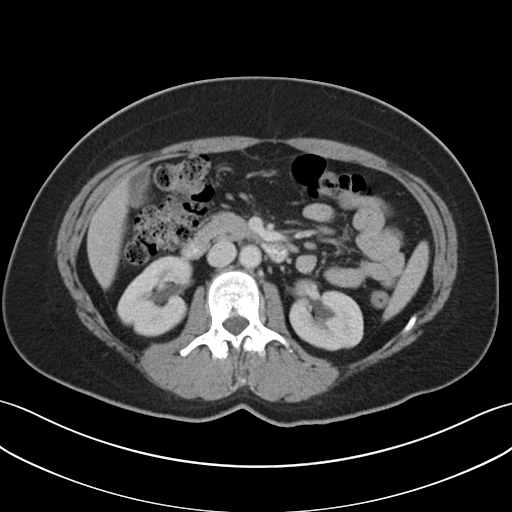
[im 69/92  soft-tissue]
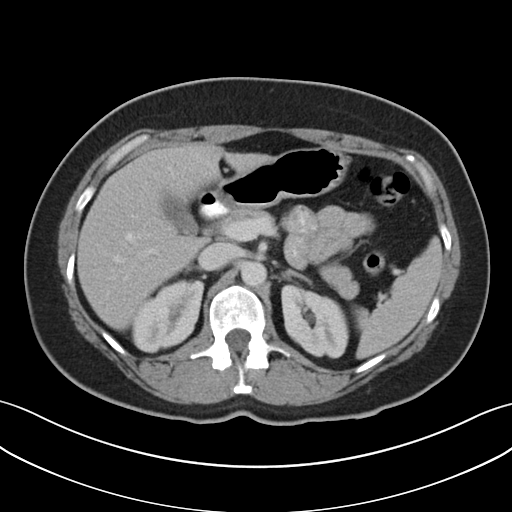
[im 73/92  soft-tissue]
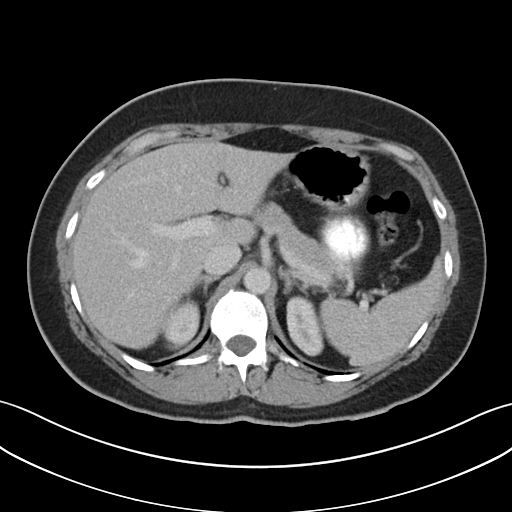
[im 80/92  soft-tissue]
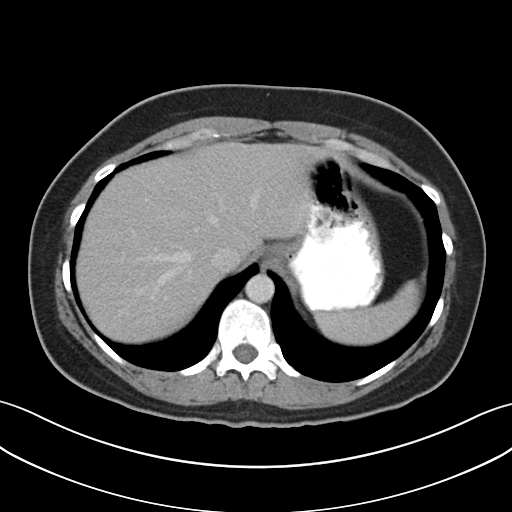
[im 88/92  soft-tissue]
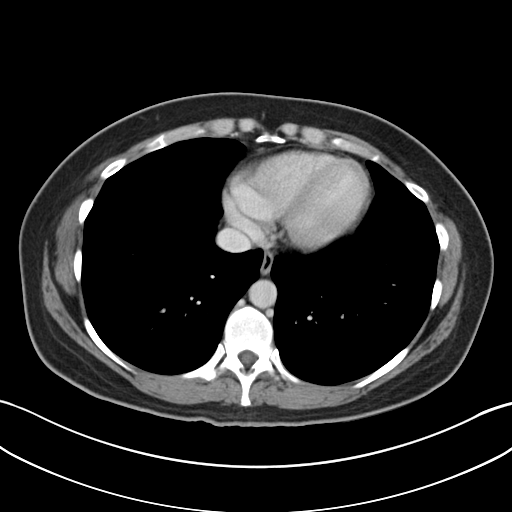

[Series 5: coronal a/|p · coronal · 0.81mm/px · 3 of 97 slices shown]
[im 33/97  soft-tissue]
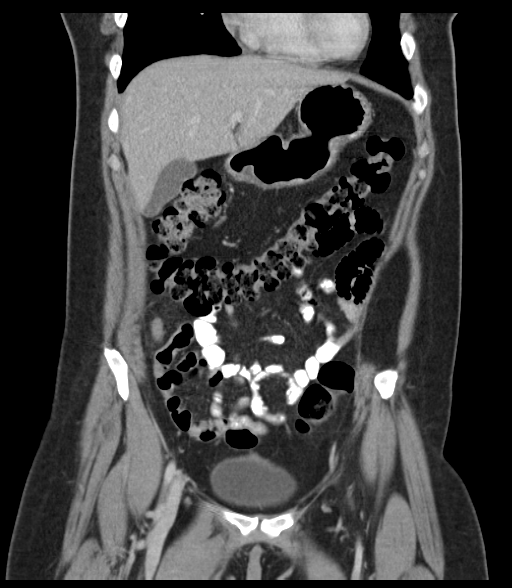
[im 43/97  soft-tissue]
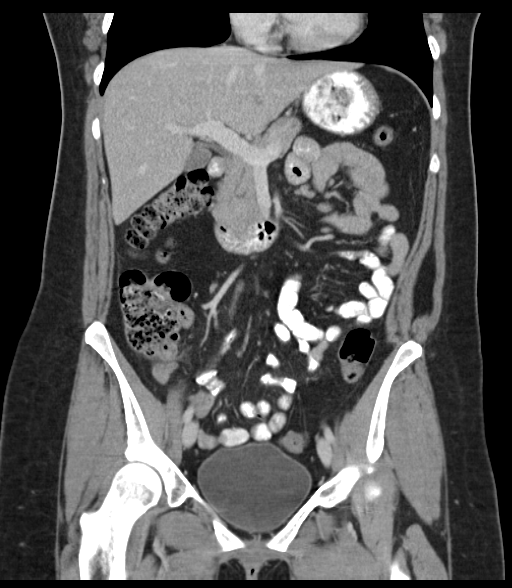
[im 54/97  soft-tissue]
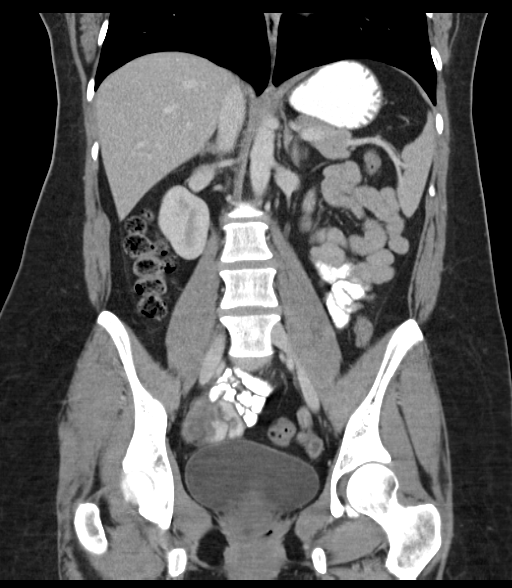

[17 of 46 positions shown; findings below may reference images not displayed]

FINDINGS: Lower chest:  No acute findings.

Hepatobiliary: No masses or other significant abnormality.
Gallbladder is unremarkable.

Pancreas: No mass, inflammatory changes, or other significant
abnormality.

Spleen: Within normal limits in size and appearance.

Adrenals/Urinary Tract: No masses identified. No evidence of
hydronephrosis.

Stomach/Bowel: Appendix since enlarged measuring 10 mm in diameter
with wall thickening and mild periappendiceal inflammatory change.
This is consistent with acute appendicitis. No evidence of bowel
obstruction. No evidence of abscess. Tiny amount of free fluid seen
in pelvic cul-de-sac.

Vascular/Lymphatic: No pathologically enlarged lymph nodes. No
evidence of abdominal aortic aneurysm.

Reproductive: Retroverted uterus seen with IUD in place. Adnexal
regions are unremarkable.

Other: None.

Musculoskeletal:  No suspicious bone lesions identified.
IMPRESSION: Positive for acute appendicitis. Small amount of free fluid in
pelvis, but no evidence of abscess new

## 2017-08-21 DIAGNOSIS — M9904 Segmental and somatic dysfunction of sacral region: Secondary | ICD-10-CM | POA: Diagnosis not present

## 2017-08-21 DIAGNOSIS — M9905 Segmental and somatic dysfunction of pelvic region: Secondary | ICD-10-CM | POA: Diagnosis not present

## 2017-08-21 DIAGNOSIS — M545 Low back pain: Secondary | ICD-10-CM | POA: Diagnosis not present

## 2017-08-22 ENCOUNTER — Encounter: Payer: Self-pay | Admitting: Physician Assistant

## 2017-08-22 MED ORDER — ALPRAZOLAM 0.5 MG PO TABS: ORAL_TABLET | ORAL | 0 refills | Status: DC

## 2017-08-22 MED FILL — predniSONE 50 MG TABS: 50 | 5 days supply | Qty: 5 | Fill #0

## 2017-08-22 MED FILL — ALPRAZolam 0.5 MG TABS: 0.5 | 30 days supply | Qty: 30 | Fill #0

## 2017-08-25 DIAGNOSIS — M9904 Segmental and somatic dysfunction of sacral region: Secondary | ICD-10-CM | POA: Diagnosis not present

## 2017-08-25 DIAGNOSIS — M9905 Segmental and somatic dysfunction of pelvic region: Secondary | ICD-10-CM | POA: Diagnosis not present

## 2017-08-25 DIAGNOSIS — M545 Low back pain: Secondary | ICD-10-CM | POA: Diagnosis not present

## 2017-08-29 DIAGNOSIS — M9905 Segmental and somatic dysfunction of pelvic region: Secondary | ICD-10-CM | POA: Diagnosis not present

## 2017-08-29 DIAGNOSIS — M9904 Segmental and somatic dysfunction of sacral region: Secondary | ICD-10-CM | POA: Diagnosis not present

## 2017-08-29 DIAGNOSIS — M545 Low back pain: Secondary | ICD-10-CM | POA: Diagnosis not present

## 2017-10-22 MED FILL — buPROPion HCL ER (XL) 150 M: 150 | 90 days supply | Qty: 90 | Fill #3

## 2017-10-22 MED FILL — ESCITALOPRAM 10 MG TABLET: 10 | 90 days supply | Qty: 90 | Fill #3

## 2017-10-22 MED FILL — METOPROLOL SUCCINATE ER 50: 50 | 90 days supply | Qty: 90 | Fill #3 | Status: TO

## 2018-01-19 ENCOUNTER — Telehealth: Payer: Self-pay

## 2018-01-19 NOTE — Telephone Encounter (Signed)
Patient called today and is scheduled to come in on Wednesday for her yearly physical and was wanting to have her labs drawn before her appointment. I will add the lab orders, but I wanted to double check with you on exactly what you want me to order? Thanks so much!

## 2018-01-20 ENCOUNTER — Encounter: Payer: Self-pay | Admitting: Physician Assistant

## 2018-01-20 DIAGNOSIS — E785 Hyperlipidemia, unspecified: Secondary | ICD-10-CM

## 2018-01-20 DIAGNOSIS — I10 Essential (primary) hypertension: Secondary | ICD-10-CM | POA: Diagnosis not present

## 2018-01-20 DIAGNOSIS — Z0000 Encounter for general adult medical examination without abnormal findings: Secondary | ICD-10-CM | POA: Diagnosis not present

## 2018-01-20 NOTE — Telephone Encounter (Signed)
Thank you :)

## 2018-01-20 NOTE — Telephone Encounter (Signed)
Lipid, cmp, Tsh

## 2018-01-21 ENCOUNTER — Encounter: Payer: 59 | Admitting: Physician Assistant

## 2018-01-21 ENCOUNTER — Other Ambulatory Visit: Payer: Self-pay

## 2018-01-21 DIAGNOSIS — I10 Essential (primary) hypertension: Secondary | ICD-10-CM

## 2018-01-21 DIAGNOSIS — I471 Supraventricular tachycardia: Secondary | ICD-10-CM

## 2018-01-21 LAB — CBC
HCT: 41.3 % (ref 35.0–45.0)
Hemoglobin: 14.2 g/dL (ref 11.7–15.5)
MCH: 31.8 pg (ref 27.0–33.0)
MCHC: 34.4 g/dL (ref 32.0–36.0)
MCV: 92.6 fL (ref 80.0–100.0)
MPV: 11.7 fL (ref 7.5–12.5)
Platelets: 173 10*3/uL (ref 140–400)
RBC: 4.46 10*6/uL (ref 3.80–5.10)
RDW: 11.8 % (ref 11.0–15.0)
WBC: 4.6 10*3/uL (ref 3.8–10.8)

## 2018-01-21 LAB — COMPLETE METABOLIC PANEL WITH GFR
AG Ratio: 2 (calc) (ref 1.0–2.5)
ALBUMIN MSPROF: 4.3 g/dL (ref 3.6–5.1)
ALT: 22 U/L (ref 6–29)
AST: 17 U/L (ref 10–30)
Alkaline phosphatase (APISO): 64 U/L (ref 33–115)
BUN: 12 mg/dL (ref 7–25)
CALCIUM: 9.5 mg/dL (ref 8.6–10.2)
CO2: 27 mmol/L (ref 20–32)
Chloride: 103 mmol/L (ref 98–110)
Creat: 0.78 mg/dL (ref 0.50–1.10)
GFR, EST NON AFRICAN AMERICAN: 94 mL/min/{1.73_m2} (ref >=60)
GFR, Est African American: 109 mL/min/{1.73_m2} (ref >=60)
GLUCOSE: 91 mg/dL (ref 65–99)
Globulin: 2.1 g/dL (calc) (ref 1.9–3.7)
Potassium: 4.8 mmol/L (ref 3.5–5.3)
Sodium: 139 mmol/L (ref 135–146)
Total Bilirubin: 0.8 mg/dL (ref 0.2–1.2)
Total Protein: 6.4 g/dL (ref 6.1–8.1)

## 2018-01-21 LAB — TSH: TSH: 1.49 mIU/L

## 2018-01-21 LAB — LIPID PANEL
Cholesterol: 195 mg/dL (ref <200)
HDL: 61 mg/dL (ref >50)
LDL Cholesterol (Calc): 119 mg/dL (calc) — ABNORMAL HIGH
Non-HDL Cholesterol (Calc): 134 mg/dL (calc) — ABNORMAL HIGH (ref <130)
Total CHOL/HDL Ratio: 3.2 (calc) (ref <5.0)
Triglycerides: 55 mg/dL (ref <150)

## 2018-01-21 MED ORDER — METOPROLOL SUCCINATE ER 50 MG PO TB24: 50.0000 mg | ORAL_TABLET | Freq: Every day | ORAL | 3 refills | Status: DC

## 2018-01-21 MED FILL — METOPROLOL SUCCINATE ER 50: 50 | 90 days supply | Qty: 90 | Fill #0

## 2018-01-21 NOTE — Telephone Encounter (Signed)
Call pt: hgb looks great. Kidney, liver, glucose looks great. Thyroid looks great. Cholesterol is better. HDL Wonderful. LDL up a little but still at goal.

## 2018-01-26 ENCOUNTER — Ambulatory Visit (INDEPENDENT_AMBULATORY_CARE_PROVIDER_SITE_OTHER): Payer: 59 | Admitting: Physician Assistant

## 2018-01-26 ENCOUNTER — Encounter: Payer: Self-pay | Admitting: Physician Assistant

## 2018-01-26 ENCOUNTER — Other Ambulatory Visit: Payer: Self-pay | Admitting: Physician Assistant

## 2018-01-26 VITALS — BP 116/62 | HR 67 | Ht 69.0 in | Wt 173.0 lb

## 2018-01-26 DIAGNOSIS — F330 Major depressive disorder, recurrent, mild: Secondary | ICD-10-CM

## 2018-01-26 DIAGNOSIS — F411 Generalized anxiety disorder: Secondary | ICD-10-CM

## 2018-01-26 DIAGNOSIS — I471 Supraventricular tachycardia: Secondary | ICD-10-CM

## 2018-01-26 DIAGNOSIS — I10 Essential (primary) hypertension: Secondary | ICD-10-CM

## 2018-01-26 DIAGNOSIS — Z0000 Encounter for general adult medical examination without abnormal findings: Secondary | ICD-10-CM

## 2018-01-26 MED ORDER — METOPROLOL SUCCINATE ER 50 MG PO TB24: 50.0000 mg | ORAL_TABLET | Freq: Every day | ORAL | 3 refills | Status: DC

## 2018-01-26 MED ORDER — ESCITALOPRAM OXALATE 10 MG PO TABS: 10.0000 mg | ORAL_TABLET | Freq: Every day | ORAL | 3 refills | Status: DC

## 2018-01-26 MED ORDER — BUPROPION HCL ER (XL) 150 MG PO TB24: 150.0000 mg | ORAL_TABLET | Freq: Every day | ORAL | 3 refills | Status: DC

## 2018-01-26 MED FILL — ESCITALOPRAM 10 MG TABLET: 10 | 30 days supply | Qty: 30 | Fill #0

## 2018-01-26 MED FILL — buPROPion HCL ER (XL) 150 M: 150 | 30 days supply | Qty: 30 | Fill #0

## 2018-01-26 NOTE — Patient Instructions (Signed)

## 2018-01-29 ENCOUNTER — Encounter: Payer: Self-pay | Admitting: Physician Assistant

## 2018-01-29 NOTE — Progress Notes (Signed)
Subjective:     Stephanie Rush is a 42 y.o. female and is here for a comprehensive physical exam. The patient reports no problems.  Social History   Socioeconomic History  . Marital status: Married    Spouse name: Not on file  . Number of children: Not on file  . Years of education: Not on file  . Highest education level: Not on file  Occupational History  . Occupation: Charity fundraiserN  Social Needs  . Financial resource strain: Not on file  . Food insecurity:    Worry: Not on file    Inability: Not on file  . Transportation needs:    Medical: Not on file    Non-medical: Not on file  Tobacco Use  . Smoking status: Former Games developermoker  . Smokeless tobacco: Never Used  Substance and Sexual Activity  . Alcohol use: Yes    Alcohol/week: 0.0 standard drinks    Comment: socially  . Drug use: No  . Sexual activity: Yes    Partners: Male  Lifestyle  . Physical activity:    Days per week: Not on file    Minutes per session: Not on file  . Stress: Not on file  Relationships  . Social connections:    Talks on phone: Not on file    Gets together: Not on file    Attends religious service: Not on file    Active member of club or organization: Not on file    Attends meetings of clubs or organizations: Not on file    Relationship status: Not on file  . Intimate partner violence:    Fear of current or ex partner: Not on file    Emotionally abused: Not on file    Physically abused: Not on file    Forced sexual activity: Not on file  Other Topics Concern  . Not on file  Social History Narrative  . Not on file   Health Maintenance  Topic Date Due  . HIV Screening  01/30/2019 (Originally 03/04/1990)  . MAMMOGRAM  02/06/2018  . TETANUS/TDAP  06/14/2019  . PAP SMEAR-Modifier  01/21/2020  . INFLUENZA VACCINE  Completed    The following portions of the patient's history were reviewed and updated as appropriate: allergies, current medications, past family history, past medical history, past social  history, past surgical history and problem list.  Review of Systems A comprehensive review of systems was negative.   Objective:    BP 116/62   Pulse 67   Ht 5\' 9"  (1.753 m)   Wt 173 lb (78.5 kg)   BMI 25.55 kg/m  General appearance: alert, cooperative and appears stated age Head: Normocephalic, without obvious abnormality, atraumatic Eyes: conjunctivae/corneas clear. PERRL, EOM's intact. Fundi benign. Ears: normal TM's and external ear canals both ears Nose: Nares normal. Septum midline. Mucosa normal. No drainage or sinus tenderness. Throat: lips, mucosa, and tongue normal; teeth and gums normal Neck: no adenopathy, no carotid bruit, no JVD, supple, symmetrical, trachea midline and thyroid not enlarged, symmetric, no tenderness/mass/nodules Back: symmetric, no curvature. ROM normal. No CVA tenderness. Lungs: clear to auscultation bilaterally Heart: regular rate and rhythm, S1, S2 normal, no murmur, click, rub or gallop Abdomen: soft, non-tender; bowel sounds normal; no masses,  no organomegaly Extremities: extremities normal, atraumatic, no cyanosis or edema Pulses: 2+ and symmetric Skin: Skin color, texture, turgor normal. No rashes or lesions Lymph nodes: Cervical, supraclavicular, and axillary nodes normal. Neurologic: Alert and oriented X 3, normal strength and tone. Normal symmetric reflexes.  Normal coordination and gait    Assessment:    Healthy female exam.      Plan:    Marland Kitchen.Marland Kitchen.Stephanie Rush was seen today for annual exam.  Diagnoses and all orders for this visit:  Routine physical examination  GAD (generalized anxiety disorder) -     buPROPion (WELLBUTRIN XL) 150 MG 24 hr tablet; Take 1 tablet (150 mg total) by mouth daily. Due for follow up visit w/PCP -     escitalopram (LEXAPRO) 10 MG tablet; Take 1 tablet (10 mg total) by mouth daily.  Mild episode of recurrent major depressive disorder (HCC) -     buPROPion (WELLBUTRIN XL) 150 MG 24 hr tablet; Take 1 tablet (150 mg  total) by mouth daily. Due for follow up visit w/PCP -     escitalopram (LEXAPRO) 10 MG tablet; Take 1 tablet (10 mg total) by mouth daily.  Essential hypertension, benign -     metoprolol succinate (TOPROL-XL) 50 MG 24 hr tablet; Take 1 tablet (50 mg total) by mouth daily.  AV nodal re-entry tachycardia (HCC) -     metoprolol succinate (TOPROL-XL) 50 MG 24 hr tablet; Take 1 tablet (50 mg total) by mouth daily.   .. [facility] screen [facility] 01/26/2018 01/20/2017  Decreased Interest 0 1  Down, Depressed, Hopeless 1 1  PHQ - 2 Score 1 2  Altered sleeping 0 -  Tired, decreased energy 0 -  Change in appetite 0 -  Feeling bad or failure about yourself  0 -  Trouble concentrating 0 -  Moving slowly or fidgety/restless 0 -  Suicidal thoughts 0 -  PHQ-9 Score 1 -  Difficult doing work/chores Not difficult at all -   .. Discussed 150 minutes of exercise a week.  Encouraged vitamin D 1000 units and Calcium 1300mg  or 4 servings of dairy a day.  Pt doing great with weight loss. BMI is 25.  BP great.  Pap/mammogram up to date.  Fasting labs reviewed.   Medication refilled. Follow up in 1 year.  See After Visit Summary for Counseling Recommendations

## 2018-03-09 MED FILL — ESCITALOPRAM 10 MG TABLET: 10 | 90 days supply | Qty: 90 | Fill #0

## 2018-03-09 MED FILL — buPROPion HCL ER (XL) 150 M: 150 | 90 days supply | Qty: 90 | Fill #0

## 2018-03-24 ENCOUNTER — Encounter: Payer: Self-pay | Admitting: Physician Assistant

## 2018-04-14 ENCOUNTER — Encounter: Payer: Self-pay | Admitting: Physician Assistant

## 2018-04-14 NOTE — Telephone Encounter (Signed)
Left VM for Pt requesting her to return clinic call so we can discuss MyChart questions.

## 2018-04-14 NOTE — Telephone Encounter (Signed)
I spoke with [facility] they are sending a form that has to be filled out by the physician and medical records attached to it. I did inform the physician of this as well - CF

## 2018-05-01 MED FILL — METOPROLOL SUCCINATE ER 50: 50 | 90 days supply | Qty: 90 | Fill #1

## 2018-05-22 MED FILL — ESCITALOPRAM 10 MG TABLET: 10 | 90 days supply | Qty: 90 | Fill #1

## 2018-05-22 MED FILL — buPROPion HCL ER (XL) 150 M: 150 | 90 days supply | Qty: 90 | Fill #1

## 2018-05-24 ENCOUNTER — Encounter: Payer: Self-pay | Admitting: Physician Assistant

## 2018-05-24 DIAGNOSIS — Z111 Encounter for screening for respiratory tuberculosis: Secondary | ICD-10-CM

## 2018-06-17 LAB — QUANTIFERON-TB GOLD PLUS
Mitogen-NIL: >10 IU/mL
NIL: 0.01 IU/mL
QuantiFERON-TB Gold Plus: NEGATIVE
TB1-NIL: 0.02 IU/mL
TB2-NIL: 0.03 IU/mL

## 2018-06-17 NOTE — Telephone Encounter (Signed)
Negative TB screen. Let patient know and see if you need to fax anywhere.

## 2018-07-08 ENCOUNTER — Other Ambulatory Visit: Payer: Self-pay | Admitting: Physician Assistant

## 2018-07-08 DIAGNOSIS — Z1231 Encounter for screening mammogram for malignant neoplasm of breast: Secondary | ICD-10-CM

## 2018-07-09 ENCOUNTER — Other Ambulatory Visit: Payer: Self-pay

## 2018-07-09 ENCOUNTER — Ambulatory Visit (INDEPENDENT_AMBULATORY_CARE_PROVIDER_SITE_OTHER): Payer: No Typology Code available for payment source

## 2018-07-09 DIAGNOSIS — Z1231 Encounter for screening mammogram for malignant neoplasm of breast: Secondary | ICD-10-CM | POA: Diagnosis not present

## 2018-07-10 ENCOUNTER — Other Ambulatory Visit: Payer: Self-pay | Admitting: *Deleted

## 2018-07-10 DIAGNOSIS — R928 Other abnormal and inconclusive findings on diagnostic imaging of breast: Secondary | ICD-10-CM

## 2018-07-14 ENCOUNTER — Other Ambulatory Visit: Payer: Self-pay | Admitting: Physician Assistant

## 2018-07-14 ENCOUNTER — Other Ambulatory Visit: Payer: Self-pay | Admitting: *Deleted

## 2018-07-14 DIAGNOSIS — R928 Other abnormal and inconclusive findings on diagnostic imaging of breast: Secondary | ICD-10-CM

## 2018-07-16 ENCOUNTER — Other Ambulatory Visit: Payer: Self-pay

## 2018-07-16 ENCOUNTER — Ambulatory Visit
Admission: RE | Admit: 2018-07-16 | Discharge: 2018-07-16 | Disposition: A | Discharge status: Home / Self Care | Payer: No Typology Code available for payment source | Source: Ambulatory Visit | Attending: *Deleted | Admitting: *Deleted

## 2018-07-16 ENCOUNTER — Ambulatory Visit: Payer: Self-pay

## 2018-07-16 DIAGNOSIS — R928 Other abnormal and inconclusive findings on diagnostic imaging of breast: Secondary | ICD-10-CM

## 2018-07-20 NOTE — Progress Notes (Signed)
Call pt: normal mammogram. Ok to resume screening yearly mammograms.

## 2018-08-19 MED FILL — ESCITALOPRAM 10 MG TABLET: 10 | 90 days supply | Qty: 90 | Fill #2

## 2018-08-19 MED FILL — buPROPion HCL ER (XL) 150 M: 150 | 90 days supply | Qty: 90 | Fill #2

## 2018-08-19 MED FILL — METOPROLOL SUCCINATE ER 50: 50 | 90 days supply | Qty: 90 | Fill #2

## 2018-11-18 MED FILL — buPROPion HCL ER (XL) 150 M: 150 | 90 days supply | Qty: 90 | Fill #3

## 2018-11-18 MED FILL — ESCITALOPRAM 10 MG TABLET: 10 | 90 days supply | Qty: 90 | Fill #3

## 2018-11-18 MED FILL — METOPROLOL SUCCINATE ER 50: 50 | 90 days supply | Qty: 90 | Fill #3

## 2019-02-23 ENCOUNTER — Other Ambulatory Visit: Payer: Self-pay | Admitting: Physician Assistant

## 2019-02-23 DIAGNOSIS — I10 Essential (primary) hypertension: Secondary | ICD-10-CM

## 2019-02-23 DIAGNOSIS — F411 Generalized anxiety disorder: Secondary | ICD-10-CM

## 2019-02-23 DIAGNOSIS — F330 Major depressive disorder, recurrent, mild: Secondary | ICD-10-CM

## 2019-02-23 DIAGNOSIS — I471 Supraventricular tachycardia: Secondary | ICD-10-CM

## 2019-02-23 MED FILL — ESCITALOPRAM 10 MG TABLET: 10 | 90 days supply | Qty: 90 | Fill #0

## 2019-02-23 MED FILL — METOPROLOL SUCCINATE ER 50: 50 | 90 days supply | Qty: 90 | Fill #0

## 2019-02-23 MED FILL — buPROPion HCL ER (XL) 150 M: 150 | 90 days supply | Qty: 90 | Fill #0

## 2019-03-08 ENCOUNTER — Ambulatory Visit (INDEPENDENT_AMBULATORY_CARE_PROVIDER_SITE_OTHER): Payer: No Typology Code available for payment source | Admitting: Obstetrics & Gynecology

## 2019-03-08 ENCOUNTER — Encounter: Payer: Self-pay | Admitting: Physician Assistant

## 2019-03-08 ENCOUNTER — Encounter: Payer: Self-pay | Admitting: Obstetrics & Gynecology

## 2019-03-08 ENCOUNTER — Other Ambulatory Visit: Payer: Self-pay

## 2019-03-08 VITALS — BP 124/67 | HR 70 | Wt 222.0 lb

## 2019-03-08 DIAGNOSIS — L02211 Cutaneous abscess of abdominal wall: Secondary | ICD-10-CM | POA: Diagnosis not present

## 2019-03-08 MED ORDER — DICLOXACILLIN SODIUM 500 MG PO CAPS: 500.0000 mg | ORAL_CAPSULE | Freq: Four times a day (QID) | ORAL | 0 refills | Status: DC

## 2019-03-08 MED FILL — DICLOXACILLIN 500 MG CAP: 500 | 10 days supply | Qty: 40 | Fill #0

## 2019-03-08 NOTE — Progress Notes (Signed)
Left breast check. Armandina Stammer RN

## 2019-03-08 NOTE — Progress Notes (Signed)
History:  44 y.o. G2P2 here today for eval of bump under the skin of her left breast. She reports that she does not recall and exact injury but, reports that it could have been a scratch from her dog. She was initially worried that it could be related to the breast. The lesion has gotten larger since I initially saw this via a video visit 3 days prev. Pt was asked at that time not to examin the area and to use warm compresses. She has a recent mammogram that is WNL.    The following portions of the patient's history were reviewed and updated as appropriate: allergies, current medications, past family history, past medical history, past social history, past surgical history and problem list.  Review of Systems:  Pertinent items are noted in HPI.    Objective:  Physical Exam Blood pressure 124/67, pulse 70, weight 222 lb (100.7 kg).  CONSTITUTIONAL: Well-developed, well-nourished female in no acute distress.  HENT:  Normocephalic, atraumatic EYES: Conjunctivae and EOM are normal. No scleral icterus.  NECK: Normal range of motion SKIN: Skin is warm and dry. No rash noted. Not diaphoretic.No pallor. NEUROLGIC: Alert and oriented to person, place, and time. Normal coordination.  Abd: left upper abd just under the breast and NOT related to the breast tissue there is a 4cm lesions which is erythematous and has an area that looks like a early point. There is no fluctuance. This is well circumscribed. It appears to be double the size from the last eval. There is tenderness to palpation.   Mammogram 07/16/2018 birad 1   Assessment & Plan:  Diagnoses and all orders for this visit:  Abscess of skin of abdomen -     dicloxacillin (DYNAPEN) 500 MG capsule; Take 1 capsule (500 mg total) by mouth 4 (four) times daily.  f/u ni 1 week  Cont warm compresses  Pt told to f/u sooner prn worsening sx or drainage.   Karelly Dewalt L. Harraway-Smith, M.D., [place] [place]

## 2019-03-08 NOTE — Patient Instructions (Signed)
Skin Abscess  A skin abscess is an infected area on or under your skin that contains a collection of pus and other material. An abscess may also be called a furuncle, carbuncle, or boil. An abscess can occur in or on almost any part of your body. Some abscesses break open (rupture) on their own. Most continue to get worse unless they are treated. The infection can spread deeper into the body and eventually into your blood, which can make you feel ill. Treatment usually involves draining the abscess. What are the causes? An abscess occurs when germs, like bacteria, pass through your skin and cause an infection. This may be caused by:  A scrape or cut on your skin.  A puncture wound through your skin, including a needle injection or insect bite.  Blocked oil or sweat glands.  Blocked and infected hair follicles.  A cyst that forms beneath your skin (sebaceous cyst) and becomes infected. What increases the risk? This condition is more likely to develop in people who:  Have a weak body defense system (immune system).  Have diabetes.  Have dry and irritated skin.  Get frequent injections or use illegal IV drugs.  Have a foreign body in a wound, such as a splinter.  Have problems with their lymph system or veins. What are the signs or symptoms? Symptoms of this condition include:  A painful, firm bump under the skin.  A bump with pus at the top. This may break through the skin and drain. Other symptoms include:  Redness surrounding the abscess site.  Warmth.  Swelling of the lymph nodes (glands) near the abscess.  Tenderness.  A sore on the skin. How is this diagnosed? This condition may be diagnosed based on:  A physical exam.  Your medical history.  A sample of pus. This may be used to find out what is causing the infection.  Blood tests.  Imaging tests, such as an ultrasound, CT scan, or MRI. How is this treated? A small abscess that drains on its own may  not need treatment. Treatment for larger abscesses may include:  Moist heat or heat pack applied to the area several times a day.  A procedure to drain the abscess (incision and drainage).  Antibiotic medicines. For a severe abscess, you may first get antibiotics through an IV and then change to antibiotics by mouth. Follow these instructions at home: Medicines   Take over-the-counter and prescription medicines only as told by your health care provider.  If you were prescribed an antibiotic medicine, take it as told by your health care provider. Do not stop taking the antibiotic even if you start to feel better. Abscess care   If you have an abscess that has not drained, apply heat to the affected area. Use the heat source that your health care provider recommends, such as a moist heat pack or a heating pad. ? Place a towel between your skin and the heat source. ? Leave the heat on for 20-30 minutes. ? Remove the heat if your skin turns bright red. This is especially important if you are unable to feel pain, heat, or cold. You may have a greater risk of getting burned.  Follow instructions from your health care provider about how to take care of your abscess. Make sure you: ? Cover the abscess with a bandage (dressing). ? Change your dressing or gauze as told by your health care provider. ? Wash your hands with soap and water before you change the   dressing or gauze. If soap and water are not available, use hand sanitizer.  Check your abscess every day for signs of a worsening infection. Check for: ? More redness, swelling, or pain. ? More fluid or blood. ? Warmth. ? More pus or a bad smell. General instructions  To avoid spreading the infection: ? Do not share personal care items, towels, or hot tubs with others. ? Avoid making skin contact with other people.  Keep all follow-up visits as told by your health care provider. This is important. Contact a health care provider if  you have:  More redness, swelling, or pain around your abscess.  More fluid or blood coming from your abscess.  Warm skin around your abscess.  More pus or a bad smell coming from your abscess.  A fever.  Muscle aches.  Chills or a general ill feeling. Get help right away if you:  Have severe pain.  See red streaks on your skin spreading away from the abscess. Summary  A skin abscess is an infected area on or under your skin that contains a collection of pus and other material.  A small abscess that drains on its own may not need treatment.  Treatment for larger abscesses may include having a procedure to drain the abscess and taking an antibiotic. This information is not intended to replace advice given to you by your health care provider. Make sure you discuss any questions you have with your health care provider. Document Revised: 05/14/2018 Document Reviewed: 03/06/2017 Elsevier Patient Education  2020 [employer]  

## 2019-03-15 ENCOUNTER — Ambulatory Visit (INDEPENDENT_AMBULATORY_CARE_PROVIDER_SITE_OTHER): Payer: No Typology Code available for payment source | Admitting: Obstetrics & Gynecology

## 2019-03-15 ENCOUNTER — Other Ambulatory Visit: Payer: Self-pay

## 2019-03-15 VITALS — BP 105/60 | HR 68 | Wt 226.0 lb

## 2019-03-15 DIAGNOSIS — L02213 Cutaneous abscess of chest wall: Secondary | ICD-10-CM

## 2019-03-15 NOTE — Progress Notes (Signed)
History:  43 y.o. G2P2 here today for f/u of abscess on chest wall. She reports that over the past few day is began to drain and feel much better. She reports that she cont to take the atbx.   The following portions of the patient's history were reviewed and updated as appropriate: allergies, current medications, past family history, past medical history, past social history, past surgical history and problem list.  Review of Systems:  Pertinent items are noted in HPI.    Objective:  Physical Exam Blood pressure 105/60, pulse 68, weight 226 lb (102.5 kg).  CONSTITUTIONAL: Well-developed, well-nourished female in no acute distress.  HENT:  Normocephalic, atraumatic EYES: Conjunctivae and EOM are normal. No scleral icterus.  NECK: Normal range of motion SKIN: Skin is warm and dry. No rash noted. Not diaphoretic.No pallor. NEUROLGIC: Alert and oriented to person, place, and time. Normal coordination.  Chest wall: under her left breast the region of the abscess is smaller. The erythema is decreased and there is no warmth. It does not appear to be draining at present.    Assessment & Plan:  Skin abscess- markedly improved  Keep Dicloxacillin to complete 10 days total   F/u prn  Hydie Langan L. Harraway-Smith, M.D., [place]

## 2019-03-15 NOTE — Progress Notes (Signed)
Patient has noted that area starting draining Friday night into Saturday. Armandina Stammer RN

## 2019-03-22 ENCOUNTER — Encounter: Payer: Self-pay | Admitting: Obstetrics & Gynecology

## 2019-05-18 ENCOUNTER — Other Ambulatory Visit: Payer: Self-pay | Admitting: Physician Assistant

## 2019-05-18 DIAGNOSIS — F411 Generalized anxiety disorder: Secondary | ICD-10-CM

## 2019-05-18 DIAGNOSIS — I471 Supraventricular tachycardia: Secondary | ICD-10-CM

## 2019-05-18 DIAGNOSIS — I10 Essential (primary) hypertension: Secondary | ICD-10-CM

## 2019-05-18 DIAGNOSIS — F330 Major depressive disorder, recurrent, mild: Secondary | ICD-10-CM

## 2019-05-18 MED FILL — METOPROLOL SUCCINATE ER 50: 50 | 30 days supply | Qty: 30 | Fill #0

## 2019-05-18 MED FILL — buPROPion HCL ER (XL) 150 M: 150 | 30 days supply | Qty: 30 | Fill #0

## 2019-05-18 MED FILL — ESCITALOPRAM 10 MG TABLET: 10 | 30 days supply | Qty: 30 | Fill #0

## 2019-05-25 ENCOUNTER — Encounter: Payer: Self-pay | Admitting: Physician Assistant

## 2019-05-25 ENCOUNTER — Other Ambulatory Visit: Payer: Self-pay | Admitting: Physician Assistant

## 2019-05-25 DIAGNOSIS — F330 Major depressive disorder, recurrent, mild: Secondary | ICD-10-CM

## 2019-05-25 DIAGNOSIS — F411 Generalized anxiety disorder: Secondary | ICD-10-CM

## 2019-05-25 DIAGNOSIS — I10 Essential (primary) hypertension: Secondary | ICD-10-CM

## 2019-05-25 DIAGNOSIS — I471 Supraventricular tachycardia: Secondary | ICD-10-CM

## 2019-05-26 MED ORDER — ESCITALOPRAM OXALATE 10 MG PO TABS: ORAL_TABLET | ORAL | 1 refills | Status: DC

## 2019-05-26 MED ORDER — BUPROPION HCL ER (XL) 150 MG PO TB24: ORAL_TABLET | ORAL | 1 refills | Status: DC

## 2019-05-26 MED ORDER — METOPROLOL SUCCINATE ER 50 MG PO TB24: ORAL_TABLET | ORAL | 1 refills | Status: DC

## 2019-05-26 NOTE — Telephone Encounter (Signed)
I gave her enough until I come back from surgery.

## 2019-06-03 ENCOUNTER — Other Ambulatory Visit: Payer: Self-pay | Admitting: Physician Assistant

## 2019-06-03 DIAGNOSIS — F411 Generalized anxiety disorder: Secondary | ICD-10-CM

## 2019-06-03 DIAGNOSIS — F330 Major depressive disorder, recurrent, mild: Secondary | ICD-10-CM

## 2019-06-04 MED FILL — ESCITALOPRAM 10 MG TABLET: 10 | 30 days supply | Qty: 30 | Fill #0

## 2019-06-04 MED FILL — buPROPion HCL ER (XL) 150 M: 150 | 30 days supply | Qty: 30 | Fill #0

## 2019-06-18 ENCOUNTER — Encounter: Payer: Self-pay | Admitting: Physician Assistant

## 2019-06-28 ENCOUNTER — Other Ambulatory Visit: Payer: Self-pay | Admitting: Neurology

## 2019-06-28 ENCOUNTER — Encounter: Payer: Self-pay | Admitting: Physician Assistant

## 2019-06-28 DIAGNOSIS — I471 Supraventricular tachycardia: Secondary | ICD-10-CM

## 2019-06-28 DIAGNOSIS — I10 Essential (primary) hypertension: Secondary | ICD-10-CM

## 2019-06-28 MED ORDER — METOPROLOL SUCCINATE ER 50 MG PO TB24: ORAL_TABLET | ORAL | 0 refills | Status: DC

## 2019-06-28 MED FILL — buPROPion HCL ER (XL) 150 M: 150 | 30 days supply | Qty: 30 | Fill #1

## 2019-06-28 MED FILL — ESCITALOPRAM 10 MG TABLET: 10 | 30 days supply | Qty: 30 | Fill #0

## 2019-06-28 MED FILL — METOPROLOL SUCCINATE ER 50: 50 | 15 days supply | Qty: 15 | Fill #0

## 2019-07-07 ENCOUNTER — Encounter: Payer: Self-pay | Admitting: Physician Assistant

## 2019-07-07 DIAGNOSIS — I10 Essential (primary) hypertension: Secondary | ICD-10-CM

## 2019-07-07 DIAGNOSIS — I471 Supraventricular tachycardia: Secondary | ICD-10-CM

## 2019-07-07 DIAGNOSIS — F330 Major depressive disorder, recurrent, mild: Secondary | ICD-10-CM

## 2019-07-07 DIAGNOSIS — F411 Generalized anxiety disorder: Secondary | ICD-10-CM

## 2019-07-07 MED ORDER — METOPROLOL SUCCINATE ER 50 MG PO TB24: ORAL_TABLET | ORAL | 1 refills | Status: DC

## 2019-07-07 MED ORDER — ESCITALOPRAM OXALATE 10 MG PO TABS: ORAL_TABLET | ORAL | 1 refills | Status: DC

## 2019-07-07 MED ORDER — BUPROPION HCL ER (XL) 150 MG PO TB24: ORAL_TABLET | ORAL | 1 refills | Status: DC

## 2019-07-09 MED FILL — buPROPion HCL ER (XL) 150 M: 150 | 30 days supply | Qty: 30 | Fill #1

## 2019-07-10 MED FILL — ESCITALOPRAM 10 MG TABLET: 10 | 30 days supply | Qty: 30 | Fill #0

## 2019-07-26 MED FILL — METOPROLOL SUCCINATE ER 50: 50 | 90 days supply | Qty: 90 | Fill #0

## 2019-08-03 MED FILL — ESCITALOPRAM 10 MG TABLET: 10 | 30 days supply | Qty: 30 | Fill #1

## 2019-08-04 MED FILL — buPROPion HCL ER (XL) 150 M: 150 | 30 days supply | Qty: 30 | Fill #0

## 2019-08-19 ENCOUNTER — Encounter: Payer: Self-pay | Admitting: Physician Assistant

## 2019-08-19 DIAGNOSIS — Z0000 Encounter for general adult medical examination without abnormal findings: Secondary | ICD-10-CM

## 2019-08-19 NOTE — Telephone Encounter (Signed)
Annual labs ordered. Does provider need to add additional labs?

## 2019-08-23 ENCOUNTER — Other Ambulatory Visit: Payer: Self-pay | Admitting: Physician Assistant

## 2019-08-23 ENCOUNTER — Ambulatory Visit (INDEPENDENT_AMBULATORY_CARE_PROVIDER_SITE_OTHER): Payer: No Typology Code available for payment source | Admitting: Physician Assistant

## 2019-08-23 ENCOUNTER — Other Ambulatory Visit: Payer: Self-pay

## 2019-08-23 ENCOUNTER — Encounter: Payer: Self-pay | Admitting: Physician Assistant

## 2019-08-23 VITALS — BP 136/69 | HR 74 | Ht 69.0 in | Wt 238.0 lb

## 2019-08-23 DIAGNOSIS — E6609 Other obesity due to excess calories: Secondary | ICD-10-CM

## 2019-08-23 DIAGNOSIS — I10 Essential (primary) hypertension: Secondary | ICD-10-CM

## 2019-08-23 DIAGNOSIS — F411 Generalized anxiety disorder: Secondary | ICD-10-CM

## 2019-08-23 DIAGNOSIS — Z0000 Encounter for general adult medical examination without abnormal findings: Secondary | ICD-10-CM

## 2019-08-23 DIAGNOSIS — Z1231 Encounter for screening mammogram for malignant neoplasm of breast: Secondary | ICD-10-CM | POA: Diagnosis not present

## 2019-08-23 DIAGNOSIS — F330 Major depressive disorder, recurrent, mild: Secondary | ICD-10-CM

## 2019-08-23 DIAGNOSIS — Z6835 Body mass index (BMI) 35.0-35.9, adult: Secondary | ICD-10-CM

## 2019-08-23 DIAGNOSIS — I471 Supraventricular tachycardia: Secondary | ICD-10-CM

## 2019-08-23 MED ORDER — WEGOVY 0.25 MG/0.5ML ~~LOC~~ SOAJ: 1.0000 "pen " | SUBCUTANEOUS | 0 refills | Status: DC

## 2019-08-23 MED ORDER — BUPROPION HCL ER (XL) 300 MG PO TB24: 300.0000 mg | ORAL_TABLET | Freq: Every day | ORAL | 1 refills | Status: DC

## 2019-08-23 MED ORDER — ESCITALOPRAM OXALATE 10 MG PO TABS: ORAL_TABLET | ORAL | 1 refills | Status: DC

## 2019-08-23 MED ORDER — METOPROLOL SUCCINATE ER 50 MG PO TB24: ORAL_TABLET | ORAL | 3 refills | Status: DC

## 2019-08-23 NOTE — Patient Instructions (Signed)
Health Maintenance, Female Adopting a healthy lifestyle and getting preventive care are important in promoting health and wellness. Ask your health care provider about:  The right schedule for you to have regular tests and exams.  Things you can do on your own to prevent diseases and keep yourself healthy. What should I know about diet, weight, and exercise? Eat a healthy diet   Eat a diet that includes plenty of vegetables, fruits, low-fat dairy products, and lean protein.  Do not eat a lot of foods that are high in solid fats, added sugars, or sodium. Maintain a healthy weight Body mass index (BMI) is used to identify weight problems. It estimates body fat based on height and weight. Your health care provider can help determine your BMI and help you achieve or maintain a healthy weight. Get regular exercise Get regular exercise. This is one of the most important things you can do for your health. Most adults should:  Exercise for at least 150 minutes each week. The exercise should increase your heart rate and make you sweat (moderate-intensity exercise).  Do strengthening exercises at least twice a week. This is in addition to the moderate-intensity exercise.  Spend less time sitting. Even light physical activity can be beneficial. Watch cholesterol and blood lipids Have your blood tested for lipids and cholesterol at 44 years of age, then have this test every 5 years. Have your cholesterol levels checked more often if:  Your lipid or cholesterol levels are high.  You are older than 44 years of age.  You are at high risk for heart disease. What should I know about cancer screening? Depending on your health history and family history, you may need to have cancer screening at various ages. This may include screening for:  Breast cancer.  Cervical cancer.  Colorectal cancer.  Skin cancer.  Lung cancer. What should I know about heart disease, diabetes, and high blood  pressure? Blood pressure and heart disease  High blood pressure causes heart disease and increases the risk of stroke. This is more likely to develop in people who have high blood pressure readings, are of African descent, or are overweight.  Have your blood pressure checked: ? Every 3-5 years if you are 18-39 years of age. ? Every year if you are 40 years old or older. Diabetes Have regular diabetes screenings. This checks your fasting blood sugar level. Have the screening done:  Once every three years after age 40 if you are at a normal weight and have a low risk for diabetes.  More often and at a younger age if you are overweight or have a high risk for diabetes. What should I know about preventing infection? Hepatitis B If you have a higher risk for hepatitis B, you should be screened for this virus. Talk with your health care provider to find out if you are at risk for hepatitis B infection. Hepatitis C Testing is recommended for:  Everyone born from 1945 through 1965.  Anyone with known risk factors for hepatitis C. Sexually transmitted infections (STIs)  Get screened for STIs, including gonorrhea and chlamydia, if: ? You are sexually active and are younger than 44 years of age. ? You are older than 44 years of age and your health care provider tells you that you are at risk for this type of infection. ? Your sexual activity has changed since you were last screened, and you are at increased risk for chlamydia or gonorrhea. Ask your health care provider if   you are at risk.  Ask your health care provider about whether you are at high risk for HIV. Your health care provider may recommend a prescription medicine to help prevent HIV infection. If you choose to take medicine to prevent HIV, you should first get tested for HIV. You should then be tested every 3 months for as long as you are taking the medicine. Pregnancy  If you are about to stop having your period (premenopausal) and  you may become pregnant, seek counseling before you get pregnant.  Take 400 to 800 micrograms (mcg) of folic acid every day if you become pregnant.  Ask for birth control (contraception) if you want to prevent pregnancy. Osteoporosis and menopause Osteoporosis is a disease in which the bones lose minerals and strength with aging. This can result in bone fractures. If you are 65 years old or older, or if you are at risk for osteoporosis and fractures, ask your health care provider if you should:  Be screened for bone loss.  Take a calcium or vitamin D supplement to lower your risk of fractures.  Be given hormone replacement therapy (HRT) to treat symptoms of menopause. Follow these instructions at home: Lifestyle  Do not use any products that contain nicotine or tobacco, such as cigarettes, e-cigarettes, and chewing tobacco. If you need help quitting, ask your health care provider.  Do not use street drugs.  Do not share needles.  Ask your health care provider for help if you need support or information about quitting drugs. Alcohol use  Do not drink alcohol if: ? Your health care provider tells you not to drink. ? You are pregnant, may be pregnant, or are planning to become pregnant.  If you drink alcohol: ? Limit how much you use to 0-1 drink a day. ? Limit intake if you are breastfeeding.  Be aware of how much alcohol is in your drink. In the [place] one drink equals one 12 oz bottle of beer (355 mL), one 5 oz glass of wine (148 mL), or one 1 oz glass of hard liquor (44 mL). General instructions  Schedule regular health, dental, and eye exams.  Stay current with your vaccines.  Tell your health care provider if: ? You often feel depressed. ? You have ever been abused or do not feel safe at home. Summary  Adopting a healthy lifestyle and getting preventive care are important in promoting health and wellness.  Follow your health care provider's instructions about healthy  diet, exercising, and getting tested or screened for diseases.  Follow your health care provider's instructions on monitoring your cholesterol and blood pressure. This information is not intended to replace advice given to you by your health care provider. Make sure you discuss any questions you have with your health care provider. Document Revised: 01/14/2018 Document Reviewed: 01/14/2018 Elsevier Patient Education  2020 Elsevier Inc.  

## 2019-08-23 NOTE — Progress Notes (Signed)
Subjective:     Stephanie Rush is a 44 y.o. female and is here for a comprehensive physical exam. The patient reports problems - weight gain and stress at home and work. .  Social History   Socioeconomic History  . Marital status: Married    Spouse name: Not on file  . Number of children: Not on file  . Years of education: Not on file  . Highest education level: Not on file  Occupational History  . Occupation: Charity fundraiser  Tobacco Use  . Smoking status: Former Games developer  . Smokeless tobacco: Never Used  Substance and Sexual Activity  . Alcohol use: Yes    Alcohol/week: 0.0 standard drinks    Comment: socially  . Drug use: No  . Sexual activity: Yes    Partners: Male  Other Topics Concern  . Not on file  Social History Narrative  . Not on file   Social Determinants of Health   Financial Resource Strain:   . Difficulty of Paying Living Expenses:   Food Insecurity:   . Worried About Programme researcher, broadcasting/film/video in the Last Year:   . Barista in the Last Year:   Transportation Needs:   . Freight forwarder (Medical):   Marland Kitchen Lack of Transportation (Non-Medical):   Physical Activity:   . Days of Exercise per Week:   . Minutes of Exercise per Session:   Stress:   . Feeling of Stress :   Social Connections:   . Frequency of Communication with Friends and Family:   . Frequency of Social Gatherings with Friends and Family:   . Attends Religious Services:   . Active Member of Clubs or Organizations:   . Attends Banker Meetings:   Marland Kitchen Marital Status:   Intimate Partner Violence:   . Fear of Current or Ex-Partner:   . Emotionally Abused:   Marland Kitchen Physically Abused:   . Sexually Abused:    Health Maintenance  Topic Date Due  . MAMMOGRAM  07/09/2019  . HIV Screening  08/22/2020 (Originally 03/04/1990)  . INFLUENZA VACCINE  09/05/2019  . PAP SMEAR-Modifier  01/21/2020  . TETANUS/TDAP  06/17/2029  . COVID-19 Vaccine  Completed  . Hepatitis C Screening  Completed    The  following portions of the patient's history were reviewed and updated as appropriate: allergies, current medications, past family history, past medical history, past social history, past surgical history and problem list.  Review of Systems Pertinent items noted in HPI and remainder of comprehensive ROS otherwise negative.   Objective:    BP 136/69   Pulse 74   Ht 5\' 9"  (1.753 m)   Wt 238 lb (108 kg)   SpO2 100%   BMI 35.15 kg/m  General appearance: alert, cooperative, appears stated age and moderately obese Head: Normocephalic, without obvious abnormality, atraumatic Eyes: conjunctivae/corneas clear. PERRL, EOM's intact. Fundi benign. Ears: normal TM's and external ear canals both ears Nose: Nares normal. Septum midline. Mucosa normal. No drainage or sinus tenderness. Throat: lips, mucosa, and tongue normal; teeth and gums normal Neck: no adenopathy, no carotid bruit, no JVD, supple, symmetrical, trachea midline and thyroid not enlarged, symmetric, no tenderness/mass/nodules Back: symmetric, no curvature. ROM normal. No CVA tenderness. Lungs: clear to auscultation bilaterally Heart: regular rate and rhythm, S1, S2 normal, no murmur, click, rub or gallop Abdomen: soft, non-tender; bowel sounds normal; no masses,  no organomegaly Extremities: extremities normal, atraumatic, no cyanosis or edema Pulses: 2+ and symmetric Skin: Skin color, texture,  turgor normal. No rashes or lesions Lymph nodes: Cervical, supraclavicular, and axillary nodes normal. Neurologic: Alert and oriented X 3, normal strength and tone. Normal symmetric reflexes. Normal coordination and gait   .Marland Kitchen Depression screen [facility] 2/9 08/23/2019 01/26/2018 01/20/2017  Decreased Interest 1 0 1  Down, Depressed, Hopeless 1 1 1   PHQ - 2 Score 2 1 2   Altered sleeping 0 0 -  Tired, decreased energy 1 0 -  Change in appetite 1 0 -  Feeling bad or failure about yourself  1 0 -  Trouble concentrating 0 0 -  Moving slowly or  fidgety/restless 0 0 -  Suicidal thoughts 0 0 -  PHQ-9 Score 5 1 -  Difficult doing work/chores Not difficult at all Not difficult at all -   .. GAD 7 : Generalized Anxiety Score 08/23/2019 01/26/2018  Nervous, Anxious, on Edge 1 1  Control/stop worrying 0 0  Worry too much - different things 0 0  Trouble relaxing 1 0  Restless 1 0  Easily annoyed or irritable 0 0  Afraid - awful might happen 0 0  Total GAD 7 Score 3 1  Anxiety Difficulty Not difficult at all Not difficult at all     Assessment:    Healthy female exam.      Plan:  7/21/202112/25/2019[name] was seen today for annual exam.  Diagnoses and all orders for this visit:  Routine physical examination  Encounter for screening mammogram for malignant neoplasm of breast -     MM 3D SCREEN BREAST BILATERAL  GAD (generalized anxiety disorder) -     buPROPion (WELLBUTRIN XL) 300 MG 24 hr tablet; Take 1 tablet (300 mg total) by mouth daily. -     escitalopram (LEXAPRO) 10 MG tablet; TAKE 1 TABLET BY MOUTH DAILY  Mild episode of recurrent major depressive disorder (HCC) -     buPROPion (WELLBUTRIN XL) 300 MG 24 hr tablet; Take 1 tablet (300 mg total) by mouth daily. -     escitalopram (LEXAPRO) 10 MG tablet; TAKE 1 TABLET BY MOUTH DAILY  Essential hypertension, benign -     metoprolol succinate (TOPROL-XL) 50 MG 24 hr tablet; TAKE 1 TABLET BY MOUTH DAILY  AV nodal re-entry tachycardia (HCC) -     metoprolol succinate (TOPROL-XL) 50 MG 24 hr tablet; TAKE 1 TABLET BY MOUTH DAILY  Class 2 obesity due to excess calories without serious comorbidity with body mass index (BMI) of 35.0 to 35.9 in adult -     Semaglutide-Weight Management (WEGOVY) 0.25 MG/0.5ML SOAJ; Inject 1 pen into the skin once a week.   .. Discussed 150 minutes of exercise a week.  Encouraged vitamin D 1000 units and Calcium 1300mg  or 4 servings of dairy a day.  Fasting labs ordered.  Mammogram ordered.  Pap up to date.  Declined STI screening.   Marland Kitchen.Discussed low  carb diet with 1500 calories and 80g of protein.  Exercising at least 150 minutes a week.  My Fitness Pal could be a Stephanie Rush.  Start wegovy. Discussed side effects and how to titrate.  Follow up in 3 months.  Call back monthly with updates.   Discussed mood. Increased wellbutrin.    See After Visit Summary for Counseling Recommendations

## 2019-08-24 NOTE — Telephone Encounter (Signed)
Stephanie Rush,   Your HDL is lower and LDL is much higher. This is probably due to weight gain. Right now overall CV risk is still pretty low. As you age and if you develop any more risk factors like hypertension or diabetes this could change. Work on diet and lifestyle changes. Will check in 6-12 months.  Thyroid normal.  UA great.  Your glucose was elevated will add a1C to evaluate for diabetes.

## 2019-08-25 LAB — COMPLETE METABOLIC PANEL WITH GFR
AG Ratio: 2.4 (calc) (ref 1.0–2.5)
ALT: 21 U/L (ref 6–29)
AST: 14 U/L (ref 10–30)
Albumin: 4.4 g/dL (ref 3.6–5.1)
Alkaline phosphatase (APISO): 73 U/L (ref 31–125)
BUN: 14 mg/dL (ref 7–25)
CO2: 29 mmol/L (ref 20–32)
Calcium: 9 mg/dL (ref 8.6–10.2)
Chloride: 103 mmol/L (ref 98–110)
Creat: 0.8 mg/dL (ref 0.50–1.10)
GFR, Est African American: 104 mL/min/{1.73_m2} (ref >=60)
GFR, Est Non African American: 90 mL/min/{1.73_m2} (ref >=60)
Globulin: 1.8 g/dL (calc) — ABNORMAL LOW (ref 1.9–3.7)
Glucose, Bld: 114 mg/dL — ABNORMAL HIGH (ref 65–99)
Potassium: 4.2 mmol/L (ref 3.5–5.3)
Sodium: 138 mmol/L (ref 135–146)
Total Bilirubin: 0.5 mg/dL (ref 0.2–1.2)
Total Protein: 6.2 g/dL (ref 6.1–8.1)

## 2019-08-25 LAB — CBC
HCT: 38 % (ref 35.0–45.0)
Hemoglobin: 12.9 g/dL (ref 11.7–15.5)
MCH: 30.8 pg (ref 27.0–33.0)
MCHC: 33.9 g/dL (ref 32.0–36.0)
MCV: 90.7 fL (ref 80.0–100.0)
MPV: 10.7 fL (ref 7.5–12.5)
Platelets: 209 10*3/uL (ref 140–400)
RBC: 4.19 10*6/uL (ref 3.80–5.10)
RDW: 12.4 % (ref 11.0–15.0)
WBC: 5.8 10*3/uL (ref 3.8–10.8)

## 2019-08-25 LAB — LIPID PANEL
Cholesterol: 225 mg/dL — ABNORMAL HIGH (ref <200)
HDL: 51 mg/dL (ref >=50)
LDL Cholesterol (Calc): 148 mg/dL (calc) — ABNORMAL HIGH
Non-HDL Cholesterol (Calc): 174 mg/dL (calc) — ABNORMAL HIGH (ref <130)
Total CHOL/HDL Ratio: 4.4 (calc) (ref <5.0)
Triglycerides: 131 mg/dL (ref <150)

## 2019-08-25 LAB — URINALYSIS, ROUTINE W REFLEX MICROSCOPIC
Bilirubin Urine: NEGATIVE
Glucose, UA: NEGATIVE
Hgb urine dipstick: NEGATIVE
Ketones, ur: NEGATIVE
Leukocytes,Ua: NEGATIVE
Nitrite: NEGATIVE
Protein, ur: NEGATIVE
Specific Gravity, Urine: 1.016 (ref 1.001–1.03)
pH: 6.5 (ref 5.0–8.0)

## 2019-08-25 LAB — TSH: TSH: 2.76 mIU/L

## 2019-08-25 LAB — HEMOGLOBIN A1C W/OUT EAG: Hgb A1c MFr Bld: 5.3 % of total Hgb (ref <5.7)

## 2019-08-25 NOTE — Telephone Encounter (Signed)
A1C still in normal range 5.3.

## 2019-08-28 MED FILL — buPROPion HCL ER (XL) 150 M: 150 | 30 days supply | Qty: 30 | Fill #0

## 2019-08-31 ENCOUNTER — Other Ambulatory Visit: Payer: Self-pay | Admitting: Neurology

## 2019-08-31 DIAGNOSIS — F411 Generalized anxiety disorder: Secondary | ICD-10-CM

## 2019-08-31 DIAGNOSIS — F330 Major depressive disorder, recurrent, mild: Secondary | ICD-10-CM

## 2019-09-02 MED FILL — ESCITALOPRAM 10 MG TABLET: 10 | 90 days supply | Qty: 90 | Fill #0

## 2019-09-03 ENCOUNTER — Other Ambulatory Visit: Payer: Self-pay | Admitting: Sports Medicine

## 2019-09-03 DIAGNOSIS — F330 Major depressive disorder, recurrent, mild: Secondary | ICD-10-CM

## 2019-09-03 DIAGNOSIS — F411 Generalized anxiety disorder: Secondary | ICD-10-CM

## 2019-09-03 MED ORDER — ESCITALOPRAM OXALATE 10 MG PO TABS: ORAL_TABLET | ORAL | 0 refills | Status: DC

## 2019-09-09 MED FILL — buPROPion HCL ER (XL) 300 M: 300 | 90 days supply | Qty: 90 | Fill #0

## 2019-09-13 MED FILL — ESCITALOPRAM 10 MG TABLET: 10 | 90 days supply | Qty: 90 | Fill #0

## 2019-09-15 ENCOUNTER — Encounter: Payer: Self-pay | Admitting: Physician Assistant

## 2019-09-20 MED ORDER — WEGOVY 0.5 MG/0.5ML ~~LOC~~ SOAJ: 0.5000 mg | SUBCUTANEOUS | 0 refills | Status: DC

## 2019-10-12 ENCOUNTER — Other Ambulatory Visit: Payer: Self-pay | Admitting: Physician Assistant

## 2019-10-12 DIAGNOSIS — Z1231 Encounter for screening mammogram for malignant neoplasm of breast: Secondary | ICD-10-CM

## 2019-10-14 ENCOUNTER — Ambulatory Visit (INDEPENDENT_AMBULATORY_CARE_PROVIDER_SITE_OTHER): Payer: No Typology Code available for payment source

## 2019-10-14 ENCOUNTER — Other Ambulatory Visit: Payer: Self-pay

## 2019-10-14 DIAGNOSIS — Z1231 Encounter for screening mammogram for malignant neoplasm of breast: Secondary | ICD-10-CM | POA: Diagnosis not present

## 2019-10-15 NOTE — Progress Notes (Signed)
Normal mammogram. Follow up in one year.

## 2019-10-18 MED ORDER — WEGOVY 1 MG/0.5ML ~~LOC~~ SOAJ: 1.0000 mg | SUBCUTANEOUS | 0 refills | Status: DC

## 2019-10-18 NOTE — Addendum Note (Signed)
Addended by: Jomarie Longs on: 10/18/2019 04:47 PM   Modules accepted: Orders

## 2019-10-19 MED FILL — METOPROLOL SUCCINATE ER 50: 50 | 30 days supply | Qty: 30 | Fill #0

## 2019-10-25 MED ORDER — WEGOVY 0.5 MG/0.5ML ~~LOC~~ SOAJ: 0.5000 mg | SUBCUTANEOUS | 0 refills | Status: DC

## 2019-10-25 NOTE — Addendum Note (Signed)
Addended by: Jomarie Longs on: 10/25/2019 05:08 PM   Modules accepted: Orders

## 2019-11-12 MED FILL — METOPROLOL SUCCINATE ER 50: 50 | 30 days supply | Qty: 30 | Fill #1

## 2019-11-23 ENCOUNTER — Other Ambulatory Visit: Payer: Self-pay | Admitting: Physician Assistant

## 2019-11-23 ENCOUNTER — Ambulatory Visit (INDEPENDENT_AMBULATORY_CARE_PROVIDER_SITE_OTHER): Payer: No Typology Code available for payment source | Admitting: Physician Assistant

## 2019-11-23 ENCOUNTER — Encounter: Payer: Self-pay | Admitting: Physician Assistant

## 2019-11-23 ENCOUNTER — Other Ambulatory Visit: Payer: Self-pay

## 2019-11-23 VITALS — BP 122/62 | HR 80 | Ht 69.0 in | Wt 215.0 lb

## 2019-11-23 DIAGNOSIS — I10 Essential (primary) hypertension: Secondary | ICD-10-CM

## 2019-11-23 DIAGNOSIS — Z6831 Body mass index (BMI) 31.0-31.9, adult: Secondary | ICD-10-CM

## 2019-11-23 DIAGNOSIS — E6609 Other obesity due to excess calories: Secondary | ICD-10-CM | POA: Diagnosis not present

## 2019-11-23 DIAGNOSIS — F411 Generalized anxiety disorder: Secondary | ICD-10-CM | POA: Diagnosis not present

## 2019-11-23 DIAGNOSIS — I471 Supraventricular tachycardia: Secondary | ICD-10-CM | POA: Diagnosis not present

## 2019-11-23 DIAGNOSIS — F330 Major depressive disorder, recurrent, mild: Secondary | ICD-10-CM

## 2019-11-23 MED ORDER — WEGOVY 1.7 MG/0.75ML ~~LOC~~ SOAJ: 1.7000 mg | SUBCUTANEOUS | 0 refills | Status: DC

## 2019-11-23 MED ORDER — METOPROLOL SUCCINATE ER 50 MG PO TB24: ORAL_TABLET | ORAL | 1 refills | Status: DC

## 2019-11-23 MED ORDER — ESCITALOPRAM OXALATE 10 MG PO TABS: ORAL_TABLET | ORAL | 0 refills | Status: DC

## 2019-11-23 MED FILL — METOPROLOL SUCCINATE ER 50: 50 | 90 days supply | Qty: 90 | Fill #0

## 2019-11-23 NOTE — Progress Notes (Signed)
Subjective:    Patient ID: Stephanie Rush, female    DOB: Oct 06, 1975, 44 y.o.   MRN: [id number]  HPI  Pt is a 44 yo obese female who presents to the clinic for 3 month follow up on weight and wegovy.   She is doing great on wegovy. Starting weight was 238 and she is now 215. She is walking and eating much less. She is on 1mg  and ready to go to the next dose. Indigestion is her worst symptoms and able to be controled with OtC tums and pepcid.   Mood is well controlled. lexapro and wellbutrin are working well. No SI/HC. She does need refills.   Pt denies any CP, palpitations, headaches or vision changes.   .. Active Ambulatory Problems    Diagnosis Date Noted  . Depression 07/21/2013  . Anxiety state, unspecified 07/21/2013  . AV nodal re-entry tachycardia (HCC) 07/21/2013  . Class 1 obesity due to excess calories without serious comorbidity with body mass index (BMI) of 31.0 to 31.9 in adult 07/21/2013  . Abnormal weight gain 07/21/2013  . Essential hypertension, benign 09/25/2013  . GAD (generalized anxiety disorder) 01/06/2015  . Elevated liver enzymes 01/06/2015  . Hyperlipidemia 01/05/2016  . Lumbar degenerative disc disease 05/15/2017   Resolved Ambulatory Problems    Diagnosis Date Noted  . Appendicitis 12/24/2014   Past Medical History:  Diagnosis Date  . Hypertension   . Vaginal Pap smear, abnormal        Review of Systems  All other systems reviewed and are negative.      Objective:   Physical Exam Vitals reviewed.  Constitutional:      Appearance: Normal appearance. She is obese.  Cardiovascular:     Rate and Rhythm: Normal rate and regular rhythm.     Pulses: Normal pulses.  Pulmonary:     Effort: Pulmonary effort is normal.  Abdominal:     General: Bowel sounds are normal. There is no distension.     Palpations: Abdomen is soft.     Tenderness: There is no abdominal tenderness.  Neurological:     General: No focal deficit present.     Mental  Status: She is alert and oriented to person, place, and time.  Psychiatric:        Mood and Affect: Mood normal.    .. Depression screen [name] 2/9 08/23/2019 01/26/2018 01/20/2017  Decreased Interest 1 0 1  Down, Depressed, Hopeless 1 1 1   PHQ - 2 Score 2 1 2   Altered sleeping 0 0 -  Tired, decreased energy 1 0 -  Change in appetite 1 0 -  Feeling bad or failure about yourself  1 0 -  Trouble concentrating 0 0 -  Moving slowly or fidgety/restless 0 0 -  Suicidal thoughts 0 0 -  PHQ-9 Score 5 1 -  Difficult doing work/chores Not difficult at all Not difficult at all -   .. GAD 7 : Generalized Anxiety Score 08/23/2019 01/26/2018  Nervous, Anxious, on Edge 1 1  Control/stop worrying 0 0  Worry too much - different things 0 0  Trouble relaxing 1 0  Restless 1 0  Easily annoyed or irritable 0 0  Afraid - awful might happen 0 0  Total GAD 7 Score 3 1  Anxiety Difficulty Not difficult at all Not difficult at all           Assessment & Plan:  7/21/2021Calvin was seen today for weight check.  Diagnoses and all  orders for this visit:  Class 1 obesity due to excess calories without serious comorbidity with body mass index (BMI) of 31.0 to 31.9 in adult -     Semaglutide-Weight Management (WEGOVY) 1.7 MG/0.75ML SOAJ; Inject 1.7 mg into the skin once a week.  Essential hypertension, benign -     metoprolol succinate (TOPROL-XL) 50 MG 24 hr tablet; TAKE 1 TABLET BY MOUTH DAILY  AV nodal re-entry tachycardia (HCC) -     metoprolol succinate (TOPROL-XL) 50 MG 24 hr tablet; TAKE 1 TABLET BY MOUTH DAILY  GAD (generalized anxiety disorder) -     escitalopram (LEXAPRO) 10 MG tablet; TAKE 1 TABLET BY MOUTH DAILY  Mild episode of recurrent major depressive disorder (HCC) -     escitalopram (LEXAPRO) 10 MG tablet; TAKE 1 TABLET BY MOUTH DAILY   BMI down from 35 to 31. Great job. Increased wegovy. Continue exercise and healthy diet.  Follow up in 44months.   Refilled toprol for AV nodal re  entry tachycardia, controlled.   Anxiety and depression controlled refilled lexapro and wellbutrin.

## 2019-12-02 MED FILL — buPROPion HCL ER (XL) 300 M: 300 | 90 days supply | Qty: 90 | Fill #1

## 2019-12-03 MED FILL — ESCITALOPRAM 10 MG TABLET: 10 | 90 days supply | Qty: 90 | Fill #0

## 2019-12-14 MED FILL — buPROPion HCL ER (XL) 300 M: 300 | 90 days supply | Qty: 90 | Fill #1

## 2019-12-28 MED FILL — buPROPion HCL ER (XL) 300 M: 300 | 90 days supply | Qty: 90 | Fill #1

## 2020-01-17 ENCOUNTER — Encounter: Payer: Self-pay | Admitting: Physician Assistant

## 2020-01-18 MED ORDER — WEGOVY 2.4 MG/0.75ML ~~LOC~~ SOAJ: 2.4000 mg | SUBCUTANEOUS | 1 refills | Status: DC

## 2020-02-15 ENCOUNTER — Encounter: Payer: Self-pay | Admitting: Physician Assistant

## 2020-02-15 MED FILL — METOPROLOL SUCCINATE ER 50: 50 | 90 days supply | Qty: 90 | Fill #1

## 2020-02-22 ENCOUNTER — Other Ambulatory Visit: Payer: Self-pay | Admitting: Physician Assistant

## 2020-02-22 ENCOUNTER — Other Ambulatory Visit: Payer: Self-pay

## 2020-02-22 ENCOUNTER — Ambulatory Visit (INDEPENDENT_AMBULATORY_CARE_PROVIDER_SITE_OTHER): Payer: No Typology Code available for payment source | Admitting: Physician Assistant

## 2020-02-22 ENCOUNTER — Encounter: Payer: Self-pay | Admitting: Physician Assistant

## 2020-02-22 VITALS — BP 102/76 | HR 84 | Wt 190.7 lb

## 2020-02-22 DIAGNOSIS — E663 Overweight: Secondary | ICD-10-CM | POA: Diagnosis not present

## 2020-02-22 DIAGNOSIS — Z8639 Personal history of other endocrine, nutritional and metabolic disease: Secondary | ICD-10-CM | POA: Diagnosis not present

## 2020-02-22 MED ORDER — TOPIRAMATE 50 MG PO TABS: ORAL_TABLET | ORAL | 0 refills | Status: DC

## 2020-02-22 MED ORDER — SAXENDA 18 MG/3ML ~~LOC~~ SOPN: 3.0000 mg | PEN_INJECTOR | Freq: Every day | SUBCUTANEOUS | 2 refills | Status: DC

## 2020-02-22 MED FILL — TOPIRAMATE 50 MG TABLET: 50 | 30 days supply | Qty: 60 | Fill #0

## 2020-02-22 NOTE — Progress Notes (Signed)
Subjective:    Patient ID: Stephanie Rush, female    DOB: 05-27-1975, 45 y.o.   MRN: [id number]  HPI Patient is a 45 year old overweight female who has been working diligently to lose weight over the past 6 months.  She has lost 50 pounds on Wegovy.  She has tolerated it great.  She has had little to no side effects.  She is not able to afford it without the coupon card.  In the coupon card has changed recently.  She wonders what other options to keep this weight off. .. Active Ambulatory Problems    Diagnosis Date Noted  . Depression 07/21/2013  . Anxiety state, unspecified 07/21/2013  . AV nodal re-entry tachycardia (HCC) 07/21/2013  . Class 1 obesity due to excess calories without serious comorbidity with body mass index (BMI) of 31.0 to 31.9 in adult 07/21/2013  . Abnormal weight gain 07/21/2013  . Essential hypertension, benign 09/25/2013  . GAD (generalized anxiety disorder) 01/06/2015  . Elevated liver enzymes 01/06/2015  . Hyperlipidemia 01/05/2016  . Lumbar degenerative disc disease 05/15/2017   Resolved Ambulatory Problems    Diagnosis Date Noted  . Appendicitis 12/24/2014   Past Medical History:  Diagnosis Date  . Hypertension   . Vaginal Pap smear, abnormal     Review of Systems See HPI.     Objective:   Physical Exam Vitals reviewed.  Constitutional:      Appearance: Normal appearance.  Cardiovascular:     Rate and Rhythm: Normal rate.  Pulmonary:     Effort: Pulmonary effort is normal.  Musculoskeletal:     Right lower leg: No edema.     Left lower leg: No edema.  Neurological:     General: No focal deficit present.     Mental Status: She is alert and oriented to person, place, and time.  Psychiatric:        Mood and Affect: Mood normal.           Assessment & Plan:  Marland KitchenMarland KitchenDiagnoses and all orders for this visit:  Overweight (BMI 25.0-29.9) -     Liraglutide -Weight Management (SAXENDA) 18 MG/3ML SOPN; Inject 3 mg into the skin daily. -      topiramate (TOPAMAX) 50 MG tablet; Start 1/2 tablet for 7 days at night then increase to 1 full tablet at night for 7 days then increase to 1 tablet in morning and at night.  History of obesity -     Liraglutide -Weight Management (SAXENDA) 18 MG/3ML SOPN; Inject 3 mg into the skin daily. -     topiramate (TOPAMAX) 50 MG tablet; Start 1/2 tablet for 7 days at night then increase to 1 full tablet at night for 7 days then increase to 1 tablet in morning and at night.   Patient is concerned about gaining all of her weight that she is lost which would be a total of 50 pounds back since [facility] is not covered.  She is doing great with this medication and tolerated it wonderfully.  She would like to maintain for a while before having to go off of it.  Unfortunately her insurance does not cover enough on this medication.  She was going to have to pay even with a coupon card $800 a month.  We discussed options.  She is already on Wellbutrin.  She does not qualify for phentermine due to her AV nodal tachycardia.  We discussed trying to see if Saxenda could get improved for stability.  We sent  this to the pharmacy as well as Topamax.  She will start 1 or the other.  Discussed side effects of both.  Clearly I want her on the GLP-1 as she tolerated it so wonderfully.  She is aware that Bernie Covey is a daily injection.  Marland Kitchen.Discussed low carb diet with 1500 calories and 80g of protein.  Exercising at least 150 minutes a week.  My Fitness Pal could be a Chief Technology Officer.

## 2020-02-23 ENCOUNTER — Encounter: Payer: Self-pay | Admitting: Physician Assistant

## 2020-02-23 ENCOUNTER — Telehealth: Payer: Self-pay | Admitting: Physician Assistant

## 2020-02-23 NOTE — Telephone Encounter (Signed)
mychart message sent

## 2020-02-23 NOTE — Telephone Encounter (Signed)
Your prior authorization for Stephanie Rush has been approved! MORE INFO Personalized support and financial assistance may be available through the [employer] program. For more information, and to see program requirements, click on the More Info button to the right.  Message from plan: The request has been approved. The authorization is effective for a maximum of 12 fills from 02/18/2020 to 02/16/2021, as long as the member is enrolled in their current health plan. The request was reviewed and approved by a licensed clinical pharmacist. This request is approved with the following quantity limits: 82mL per 28 days. A written notification letter will follow with additional details.

## 2020-02-25 ENCOUNTER — Other Ambulatory Visit: Payer: Self-pay

## 2020-02-25 DIAGNOSIS — F411 Generalized anxiety disorder: Secondary | ICD-10-CM

## 2020-02-25 DIAGNOSIS — F330 Major depressive disorder, recurrent, mild: Secondary | ICD-10-CM

## 2020-02-25 MED ORDER — ESCITALOPRAM OXALATE 10 MG PO TABS: ORAL_TABLET | ORAL | 0 refills | Status: DC

## 2020-02-25 MED FILL — ESCITALOPRAM 10 MG TABLET: 10 | 90 days supply | Qty: 90 | Fill #1

## 2020-03-17 ENCOUNTER — Other Ambulatory Visit: Payer: Self-pay | Admitting: Physician Assistant

## 2020-03-17 DIAGNOSIS — Z8639 Personal history of other endocrine, nutritional and metabolic disease: Secondary | ICD-10-CM

## 2020-03-17 DIAGNOSIS — E663 Overweight: Secondary | ICD-10-CM

## 2020-03-17 MED FILL — TOPIRAMATE 50 MG TABLET: 50 | 30 days supply | Qty: 60 | Fill #0

## 2020-03-20 ENCOUNTER — Encounter: Payer: Self-pay | Admitting: Physician Assistant

## 2020-03-20 ENCOUNTER — Other Ambulatory Visit: Payer: Self-pay | Admitting: Physician Assistant

## 2020-03-20 MED ORDER — WEGOVY 2.4 MG/0.75ML ~~LOC~~ SOAJ: 2.4000 mg | SUBCUTANEOUS | 2 refills | Status: DC

## 2020-03-20 MED FILL — WEGOVY 2.4 MG/0.75ML SOAJ: 2.4 | 28 days supply | Qty: 3 | Fill #0

## 2020-03-20 NOTE — Progress Notes (Signed)
wegovy refill.

## 2020-03-21 ENCOUNTER — Other Ambulatory Visit: Payer: Self-pay | Admitting: Physician Assistant

## 2020-03-21 DIAGNOSIS — F411 Generalized anxiety disorder: Secondary | ICD-10-CM

## 2020-03-21 DIAGNOSIS — F330 Major depressive disorder, recurrent, mild: Secondary | ICD-10-CM

## 2020-03-21 MED FILL — buPROPion HCL ER (XL) 300 M: 300 | 90 days supply | Qty: 90 | Fill #0

## 2020-04-10 MED FILL — TOPIRAMATE 50 MG TABLET: 50 | 30 days supply | Qty: 60 | Fill #1

## 2020-04-11 MED FILL — WEGOVY 2.4 MG/0.75ML SOAJ: 2.4 | 28 days supply | Qty: 3 | Fill #1

## 2020-04-17 ENCOUNTER — Other Ambulatory Visit (HOSPITAL_COMMUNITY): Payer: Self-pay

## 2020-04-27 ENCOUNTER — Other Ambulatory Visit (HOSPITAL_BASED_OUTPATIENT_CLINIC_OR_DEPARTMENT_OTHER): Payer: Self-pay

## 2020-05-03 MED FILL — WEGOVY 2.4 MG/0.75ML SOAJ: 2.4 | 28 days supply | Qty: 3 | Fill #2

## 2020-05-06 ENCOUNTER — Telehealth: Payer: No Typology Code available for payment source | Admitting: Physician Assistant

## 2020-05-06 ENCOUNTER — Encounter: Payer: Self-pay | Admitting: Physician Assistant

## 2020-05-06 DIAGNOSIS — H109 Unspecified conjunctivitis: Secondary | ICD-10-CM

## 2020-05-06 MED ORDER — POLYMYXIN B-TRIMETHOPRIM 10000-0.1 UNIT/ML-% OP SOLN: 2.0000 [drp] | Freq: Four times a day (QID) | OPHTHALMIC | 0 refills | Status: DC

## 2020-05-06 MED ORDER — POLYMYXIN B-TRIMETHOPRIM 10000-0.1 UNIT/ML-% OP SOLN: 1.0000 [drp] | Freq: Four times a day (QID) | OPHTHALMIC | 0 refills | Status: DC

## 2020-05-06 NOTE — Progress Notes (Signed)
E-Visit for Pink Eye ° ° °We are sorry that you are not feeling well.  Here is how we plan to help! ° °Based on what you have shared with me it looks like you have conjunctivitis.  Conjunctivitis is a common inflammatory or infectious condition of the eye that is often referred to as "pink eye".  In most cases it is contagious (viral or bacterial). However, not all conjunctivitis requires antibiotics (ex. Allergic).  We have made appropriate suggestions for you based upon your presentation. ° °I have prescribed Polytrim Ophthalmic drops 1-2 drops 4 times a day times 5 days ° °Pink eye can be highly contagious.  It is typically spread through direct contact with secretions, or contaminated objects or surfaces that one may have touched.  Strict handwashing is suggested with soap and water is urged.  If not available, use alcohol based had sanitizer.  Avoid unnecessary touching of the eye.  If you wear contact lenses, you will need to refrain from wearing them until you see no white discharge from the eye for at least 24 hours after being on medication.  You should see symptom improvement in 1-2 days after starting the medication regimen.  Call us if symptoms are not improved in 1-2 days. ° °Home Care: °· Wash your hands often! °· Do not wear your contacts until you complete your treatment plan. °· Avoid sharing towels, bed linen, personal items with a person who has pink eye. °· See attention for anyone in your home with similar symptoms. ° °Get Help Right Away If: °· Your symptoms do not improve. °· You develop blurred or loss of vision. °· Your symptoms worsen (increased discharge, pain or redness) ° °Your e-visit answers were reviewed by a board certified advanced clinical practitioner to complete your personal care plan.  Depending on the condition, your plan could have included both over the counter or prescription medications. ° °If there is a problem please reply  once you have received a response from your  provider. ° °Your safety is important to us.  If you have drug allergies check your prescription carefully.   ° °You can use MyChart to ask questions about today’s visit, request a non-urgent call back, or ask for a work or school excuse for 24 hours related to this e-Visit. If it has been greater than 24 hours you will need to follow up with your provider, or enter a new e-Visit to address those concerns. ° ° °You will get an e-mail in the next two days asking about your experience.  I hope that your e-visit has been valuable and will speed your recovery. Thank you for using e-visits. ° ° ° ° °I spent 5-10 minutes on review and completion of this note- Roxas Clymer PAC ° °

## 2020-05-08 ENCOUNTER — Other Ambulatory Visit (HOSPITAL_COMMUNITY): Payer: Self-pay

## 2020-05-11 ENCOUNTER — Other Ambulatory Visit (HOSPITAL_COMMUNITY): Payer: Self-pay

## 2020-05-22 ENCOUNTER — Other Ambulatory Visit (HOSPITAL_COMMUNITY): Payer: Self-pay

## 2020-05-22 ENCOUNTER — Other Ambulatory Visit: Payer: Self-pay | Admitting: Physician Assistant

## 2020-05-22 DIAGNOSIS — I10 Essential (primary) hypertension: Secondary | ICD-10-CM

## 2020-05-22 DIAGNOSIS — I471 Supraventricular tachycardia: Secondary | ICD-10-CM

## 2020-05-22 MED ORDER — METOPROLOL SUCCINATE ER 50 MG PO TB24
50.0000 mg | ORAL_TABLET | Freq: Every day | ORAL | 0 refills | Status: DC
  Filled 2020-05-22: qty 90, 90d supply, fill #0

## 2020-05-24 ENCOUNTER — Other Ambulatory Visit (HOSPITAL_COMMUNITY): Payer: Self-pay

## 2020-05-29 ENCOUNTER — Ambulatory Visit (INDEPENDENT_AMBULATORY_CARE_PROVIDER_SITE_OTHER): Payer: No Typology Code available for payment source | Admitting: Physician Assistant

## 2020-05-29 ENCOUNTER — Other Ambulatory Visit: Payer: Self-pay

## 2020-05-29 ENCOUNTER — Other Ambulatory Visit (HOSPITAL_COMMUNITY): Payer: Self-pay

## 2020-05-29 ENCOUNTER — Encounter: Payer: Self-pay | Admitting: Physician Assistant

## 2020-05-29 VITALS — BP 106/46 | HR 85 | Ht 64.0 in | Wt 175.0 lb

## 2020-05-29 DIAGNOSIS — I959 Hypotension, unspecified: Secondary | ICD-10-CM

## 2020-05-29 DIAGNOSIS — R7309 Other abnormal glucose: Secondary | ICD-10-CM | POA: Diagnosis not present

## 2020-05-29 DIAGNOSIS — Z1211 Encounter for screening for malignant neoplasm of colon: Secondary | ICD-10-CM

## 2020-05-29 DIAGNOSIS — F411 Generalized anxiety disorder: Secondary | ICD-10-CM

## 2020-05-29 DIAGNOSIS — E6609 Other obesity due to excess calories: Secondary | ICD-10-CM | POA: Diagnosis not present

## 2020-05-29 DIAGNOSIS — E785 Hyperlipidemia, unspecified: Secondary | ICD-10-CM

## 2020-05-29 DIAGNOSIS — I471 Supraventricular tachycardia: Secondary | ICD-10-CM

## 2020-05-29 DIAGNOSIS — I10 Essential (primary) hypertension: Secondary | ICD-10-CM

## 2020-05-29 DIAGNOSIS — F330 Major depressive disorder, recurrent, mild: Secondary | ICD-10-CM

## 2020-05-29 DIAGNOSIS — Z6835 Body mass index (BMI) 35.0-35.9, adult: Secondary | ICD-10-CM

## 2020-05-29 MED ORDER — METOPROLOL SUCCINATE ER 50 MG PO TB24
50.0000 mg | ORAL_TABLET | Freq: Every day | ORAL | 3 refills | Status: DC
  Filled 2020-05-29 – 2020-08-24 (×2): qty 90, 90d supply, fill #0

## 2020-05-29 MED ORDER — ESCITALOPRAM OXALATE 10 MG PO TABS
ORAL_TABLET | Freq: Every day | ORAL | 1 refills | Status: DC
  Filled 2020-05-29: qty 90, 90d supply, fill #0
  Filled 2020-09-13: qty 90, 90d supply, fill #1

## 2020-05-29 MED ORDER — BUPROPION HCL ER (XL) 300 MG PO TB24
ORAL_TABLET | Freq: Every day | ORAL | 1 refills | Status: DC
  Filled 2020-05-29 – 2020-06-28 (×2): qty 90, 90d supply, fill #0
  Filled 2020-09-13: qty 90, 90d supply, fill #1

## 2020-05-29 NOTE — Progress Notes (Addendum)
Subjective:    Patient ID: Stephanie Rush, female    DOB: 1975/09/14, 45 y.o.   MRN: [id number]  HPI Patient is a 45 year old overweight female who was started on Wegovy 9 months ago. She has gone from 238 lbs to today's weight of 175 lbs. She continues to improve her diet and exercise. She has noticed a decrease in appetite and has tolerated the medication well.  She has noticed that occasionally she feels light headed when standing up. She has eliminated salt from her diet and reports a low fluid intake. When she has lost weight in the past, her blood pressure has fallen and she has reduced her metoprolol to half the dose.  Her mood has been well managed with wellbutrin and lexapro. She no longer feels the need to take her Xanax.   Active Ambulatory Problems    Diagnosis Date Noted  . Depression 07/21/2013  . Anxiety state, unspecified 07/21/2013  . AV nodal re-entry tachycardia (HCC) 07/21/2013  . Class 1 obesity due to excess calories without serious comorbidity with body mass index (BMI) of 31.0 to 31.9 in adult 07/21/2013  . Abnormal weight gain 07/21/2013  . Essential hypertension, benign 09/25/2013  . GAD (generalized anxiety disorder) 01/06/2015  . Elevated liver enzymes 01/06/2015  . Hyperlipidemia 01/05/2016  . Lumbar degenerative disc disease 05/15/2017  . Hypotension 05/30/2020   Resolved Ambulatory Problems    Diagnosis Date Noted  . Appendicitis 12/24/2014   Past Medical History:  Diagnosis Date  . Hypertension   . Vaginal Pap smear, abnormal     Review of Systems  Constitutional: Positive for appetite change (decreased). Negative for chills, fatigue and fever.  Respiratory: Negative for chest tightness and shortness of breath.   Cardiovascular: Negative for chest pain and palpitations.  Musculoskeletal: Negative for back pain and myalgias.  Neurological: Positive for light-headedness. Negative for dizziness and syncope.  Psychiatric/Behavioral: Negative for  behavioral problems, decreased concentration and dysphoric mood. The patient is not nervous/anxious.       Objective:   Physical Exam Vitals reviewed.  Constitutional:      Appearance: Normal appearance.  Cardiovascular:     Rate and Rhythm: Normal rate.  Pulmonary:     Effort: Pulmonary effort is normal.  Musculoskeletal:     Right lower leg: No edema.     Left lower leg: No edema.  Neurological:     General: No focal deficit present.     Mental Status: She is alert and oriented to person, place, and time.  Psychiatric:        Mood and Affect: Mood normal.    .. Depression screen [facility] 2/9 05/29/2020 08/23/2019 01/26/2018 01/20/2017  Decreased Interest 0 1 0 1  Down, Depressed, Hopeless 1 1 1 1   PHQ - 2 Score 1 2 1 2   Altered sleeping 0 0 0 -  Tired, decreased energy 1 1 0 -  Change in appetite 0 1 0 -  Feeling bad or failure about yourself  0 1 0 -  Trouble concentrating 0 0 0 -  Moving slowly or fidgety/restless 0 0 0 -  Suicidal thoughts 0 0 0 -  PHQ-9 Score 2 5 1  -  Difficult doing work/chores Not difficult at all Not difficult at all Not difficult at all -   .. GAD 7 : Generalized Anxiety Score 05/29/2020 08/23/2019 01/26/2018  Nervous, Anxious, on Edge 0 1 1  Control/stop worrying 0 0 0  Worry too much - different things  0 0 0  Trouble relaxing 0 1 0  Restless 0 1 0  Easily annoyed or irritable 0 0 0  Afraid - awful might happen 0 0 0  Total GAD 7 Score 0 3 1  Anxiety Difficulty Not difficult at all Not difficult at all Not difficult at all           Assessment & Plan:  Marland KitchenMarland KitchenHalona was seen today for weight check.  Diagnoses and all orders for this visit:  Class 2 obesity due to excess calories without serious comorbidity with body mass index (BMI) of 35.0 to 35.9 in adult -     Lipid Panel w/reflex Direct LDL -     Hemoglobin A1c -     COMPLETE METABOLIC PANEL WITH GFR  Hyperlipidemia, unspecified hyperlipidemia type -     Lipid Panel w/reflex Direct  LDL  Elevated glucose -     Hemoglobin A1c  GAD (generalized anxiety disorder) -     buPROPion (WELLBUTRIN XL) 300 MG 24 hr tablet; TAKE 1 TABLET BY MOUTH DAILY. -     escitalopram (LEXAPRO) 10 MG tablet; TAKE 1 TABLET BY MOUTH DAILY  Mild episode of recurrent major depressive disorder (HCC) -     buPROPion (WELLBUTRIN XL) 300 MG 24 hr tablet; TAKE 1 TABLET BY MOUTH DAILY. -     escitalopram (LEXAPRO) 10 MG tablet; TAKE 1 TABLET BY MOUTH DAILY  AV nodal re-entry tachycardia (HCC) -     metoprolol succinate (TOPROL-XL) 50 MG 24 hr tablet; Take 1 tablet (50 mg total) by mouth daily.  Colon cancer screening -     Ambulatory referral to Gastroenterology  Hypotension, unspecified hypotension type  Other orders -     Semaglutide-Weight Management 2.4 MG/0.75ML SOAJ; Inject 2.4 mg into the skin once a week.   Pt is doing great with weight loss. BMI 30 but headed in the right direction. Goal BMI 25-27. Continue wegovy with diet and lifestyle changes.  Follow up in 6 months.   Patient is concerned with her low blood pressure and light headed feeling. Discussed increasing fluid and salt intake and continue to monitor. The patient can adjust metoprolol succinate dosing to half her current dose and see if that helps at all with increasing BP a little but does not cause palpitations to worsen.   PHQ/GAD numbers stable. Continue lexapro and wellbutrin. Xanax taken off list as patient does not take anymore.   Pt is 45-colonoscopy ordered for screening.   Screening labs ordered today for recheck.   Follow up in 6 months.   Marland KitchenHarlon Flor PA-C, have reviewed and agree with the above documentation in it's entirety.

## 2020-05-30 ENCOUNTER — Other Ambulatory Visit (HOSPITAL_COMMUNITY): Payer: Self-pay

## 2020-05-30 DIAGNOSIS — I959 Hypotension, unspecified: Secondary | ICD-10-CM | POA: Insufficient documentation

## 2020-05-30 MED ORDER — SEMAGLUTIDE-WEIGHT MANAGEMENT 2.4 MG/0.75ML ~~LOC~~ SOAJ
2.4000 mg | SUBCUTANEOUS | 1 refills | Status: DC
  Filled 2020-05-30: qty 3, 28d supply, fill #0
  Filled 2020-07-12: qty 3, 28d supply, fill #1
  Filled 2020-08-04: qty 3, 28d supply, fill #2

## 2020-06-06 ENCOUNTER — Other Ambulatory Visit (HOSPITAL_COMMUNITY): Payer: Self-pay

## 2020-06-08 ENCOUNTER — Other Ambulatory Visit (HOSPITAL_COMMUNITY): Payer: Self-pay

## 2020-06-28 ENCOUNTER — Other Ambulatory Visit (HOSPITAL_COMMUNITY): Payer: Self-pay

## 2020-06-29 ENCOUNTER — Other Ambulatory Visit (HOSPITAL_COMMUNITY): Payer: Self-pay

## 2020-07-12 ENCOUNTER — Other Ambulatory Visit (HOSPITAL_COMMUNITY): Payer: Self-pay

## 2020-07-27 ENCOUNTER — Encounter: Payer: Self-pay | Admitting: Physician Assistant

## 2020-07-28 ENCOUNTER — Other Ambulatory Visit (HOSPITAL_COMMUNITY): Payer: Self-pay

## 2020-07-28 MED ORDER — PREDNISONE 50 MG PO TABS: ORAL_TABLET | ORAL | 0 refills | Status: DC

## 2020-07-28 MED ORDER — PREDNISONE 50 MG PO TABS
ORAL_TABLET | ORAL | 0 refills | Status: DC
Start: 2020-07-28 — End: 2020-07-28
  Filled 2020-07-28: qty 5, 5d supply, fill #0

## 2020-07-28 NOTE — Addendum Note (Signed)
Addended by: Chalmers Cater on: 07/28/2020 11:53 AM   Modules accepted: Orders

## 2020-08-04 ENCOUNTER — Other Ambulatory Visit (HOSPITAL_COMMUNITY): Payer: Self-pay

## 2020-08-16 ENCOUNTER — Telehealth: Payer: No Typology Code available for payment source | Admitting: Physician Assistant

## 2020-08-16 DIAGNOSIS — J069 Acute upper respiratory infection, unspecified: Secondary | ICD-10-CM | POA: Diagnosis not present

## 2020-08-17 MED ORDER — FLUTICASONE PROPIONATE 50 MCG/ACT NA SUSP: 2.0000 | Freq: Every day | NASAL | 0 refills | Status: DC

## 2020-08-17 MED ORDER — BENZONATATE 100 MG PO CAPS: 100.0000 mg | ORAL_CAPSULE | Freq: Three times a day (TID) | ORAL | 0 refills | Status: DC | PRN

## 2020-08-17 NOTE — Progress Notes (Signed)

## 2020-08-17 NOTE — Progress Notes (Signed)
I have spent 5 minutes in review of e-visit questionnaire, review and updating patient chart, medical decision making and response to patient.   Mazikeen Hehn Cody Sheran Newstrom, PA-C    

## 2020-08-18 ENCOUNTER — Encounter: Payer: No Typology Code available for payment source | Admitting: Physician Assistant

## 2020-08-24 ENCOUNTER — Other Ambulatory Visit (HOSPITAL_COMMUNITY): Payer: Self-pay

## 2020-08-25 ENCOUNTER — Other Ambulatory Visit (HOSPITAL_COMMUNITY): Payer: Self-pay

## 2020-08-30 ENCOUNTER — Encounter: Payer: Self-pay | Admitting: Physician Assistant

## 2020-09-01 ENCOUNTER — Encounter: Payer: Self-pay | Admitting: Physician Assistant

## 2020-09-01 ENCOUNTER — Ambulatory Visit (INDEPENDENT_AMBULATORY_CARE_PROVIDER_SITE_OTHER): Payer: No Typology Code available for payment source | Admitting: Physician Assistant

## 2020-09-01 ENCOUNTER — Other Ambulatory Visit (HOSPITAL_COMMUNITY): Payer: Self-pay

## 2020-09-01 ENCOUNTER — Other Ambulatory Visit: Payer: Self-pay

## 2020-09-01 VITALS — BP 95/53 | HR 94 | Temp 98.2°F | Ht 68.0 in | Wt 158.2 lb

## 2020-09-01 DIAGNOSIS — F330 Major depressive disorder, recurrent, mild: Secondary | ICD-10-CM | POA: Diagnosis not present

## 2020-09-01 DIAGNOSIS — Z8639 Personal history of other endocrine, nutritional and metabolic disease: Secondary | ICD-10-CM

## 2020-09-01 DIAGNOSIS — F411 Generalized anxiety disorder: Secondary | ICD-10-CM

## 2020-09-01 DIAGNOSIS — Z1211 Encounter for screening for malignant neoplasm of colon: Secondary | ICD-10-CM | POA: Diagnosis not present

## 2020-09-01 DIAGNOSIS — Z0000 Encounter for general adult medical examination without abnormal findings: Secondary | ICD-10-CM | POA: Diagnosis not present

## 2020-09-01 DIAGNOSIS — R058 Other specified cough: Secondary | ICD-10-CM | POA: Diagnosis not present

## 2020-09-01 MED ORDER — METHYLPREDNISOLONE SODIUM SUCC 125 MG IJ SOLR
125.0000 mg | Freq: Once | INTRAMUSCULAR | Status: AC
  Administered 2020-09-01: 125 mg via INTRAMUSCULAR

## 2020-09-01 MED ORDER — SEMAGLUTIDE-WEIGHT MANAGEMENT 2.4 MG/0.75ML ~~LOC~~ SOAJ
2.4000 mg | SUBCUTANEOUS | 11 refills | Status: AC
  Filled 2020-09-01 – 2020-09-11 (×2): qty 3, 28d supply, fill #0
  Filled 2020-10-03: qty 3, 28d supply, fill #1
  Filled 2020-11-01: qty 3, 28d supply, fill #2
  Filled 2020-12-01: qty 3, 28d supply, fill #3
  Filled 2020-12-26: qty 3, 28d supply, fill #4
  Filled 2021-01-25: qty 3, 28d supply, fill #5
  Filled 2021-03-07: qty 3, 28d supply, fill #6
  Filled 2021-03-30: qty 3, 28d supply, fill #7
  Filled 2021-04-30: qty 3, 28d supply, fill #8
  Filled 2021-06-01: qty 3, 28d supply, fill #9
  Filled 2021-07-06: qty 3, 28d supply, fill #10
  Filled 2021-08-05: qty 3, 28d supply, fill #11

## 2020-09-01 NOTE — Patient Instructions (Signed)
Health Maintenance, Female Adopting a healthy lifestyle and getting preventive care are important in promoting health and wellness. Ask your health care provider about: The right schedule for you to have regular tests and exams. Things you can do on your own to prevent diseases and keep yourself healthy. What should I know about diet, weight, and exercise? Eat a healthy diet  Eat a diet that includes plenty of vegetables, fruits, low-fat dairy products, and lean protein. Do not eat a lot of foods that are high in solid fats, added sugars, or sodium.  Maintain a healthy weight Body mass index (BMI) is used to identify weight problems. It estimates body fat based on height and weight. Your health care provider can help determineyour BMI and help you achieve or maintain a healthy weight. Get regular exercise Get regular exercise. This is one of the most important things you can do for your health. Most adults should: Exercise for at least 150 minutes each week. The exercise should increase your heart rate and make you sweat (moderate-intensity exercise). Do strengthening exercises at least twice a week. This is in addition to the moderate-intensity exercise. Spend less time sitting. Even light physical activity can be beneficial. Watch cholesterol and blood lipids Have your blood tested for lipids and cholesterol at 45 years of age, then havethis test every 5 years. Have your cholesterol levels checked more often if: Your lipid or cholesterol levels are high. You are older than 45 years of age. You are at high risk for heart disease. What should I know about cancer screening? Depending on your health history and family history, you may need to have cancer screening at various ages. This may include screening for: Breast cancer. Cervical cancer. Colorectal cancer. Skin cancer. Lung cancer. What should I know about heart disease, diabetes, and high blood pressure? Blood pressure and heart  disease High blood pressure causes heart disease and increases the risk of stroke. This is more likely to develop in people who have high blood pressure readings, are of African descent, or are overweight. Have your blood pressure checked: Every 3-5 years if you are 18-39 years of age. Every year if you are 40 years old or older. Diabetes Have regular diabetes screenings. This checks your fasting blood sugar level. Have the screening done: Once every three years after age 40 if you are at a normal weight and have a low risk for diabetes. More often and at a younger age if you are overweight or have a high risk for diabetes. What should I know about preventing infection? Hepatitis B If you have a higher risk for hepatitis B, you should be screened for this virus. Talk with your health care provider to find out if you are at risk forhepatitis B infection. Hepatitis C Testing is recommended for: Everyone born from 1945 through 1965. Anyone with known risk factors for hepatitis C. Sexually transmitted infections (STIs) Get screened for STIs, including gonorrhea and chlamydia, if: You are sexually active and are younger than 45 years of age. You are older than 45 years of age and your health care provider tells you that you are at risk for this type of infection. Your sexual activity has changed since you were last screened, and you are at increased risk for chlamydia or gonorrhea. Ask your health care provider if you are at risk. Ask your health care provider about whether you are at high risk for HIV. Your health care provider may recommend a prescription medicine to help   prevent HIV infection. If you choose to take medicine to prevent HIV, you should first get tested for HIV. You should then be tested every 3 months for as long as you are taking the medicine. Pregnancy If you are about to stop having your period (premenopausal) and you may become pregnant, seek counseling before you get  pregnant. Take 400 to 800 micrograms (mcg) of folic acid every day if you become pregnant. Ask for birth control (contraception) if you want to prevent pregnancy. Osteoporosis and menopause Osteoporosis is a disease in which the bones lose minerals and strength with aging. This can result in bone fractures. If you are 65 years old or older, or if you are at risk for osteoporosis and fractures, ask your health care provider if you should: Be screened for bone loss. Take a calcium or vitamin D supplement to lower your risk of fractures. Be given hormone replacement therapy (HRT) to treat symptoms of menopause. Follow these instructions at home: Lifestyle Do not use any products that contain nicotine or tobacco, such as cigarettes, e-cigarettes, and chewing tobacco. If you need help quitting, ask your health care provider. Do not use street drugs. Do not share needles. Ask your health care provider for help if you need support or information about quitting drugs. Alcohol use Do not drink alcohol if: Your health care provider tells you not to drink. You are pregnant, may be pregnant, or are planning to become pregnant. If you drink alcohol: Limit how much you use to 0-1 drink a day. Limit intake if you are breastfeeding. Be aware of how much alcohol is in your drink. In the [place] one drink equals one 12 oz bottle of beer (355 mL), one 5 oz glass of wine (148 mL), or one 1 oz glass of hard liquor (44 mL). General instructions Schedule regular health, dental, and eye exams. Stay current with your vaccines. Tell your health care provider if: You often feel depressed. You have ever been abused or do not feel safe at home. Summary Adopting a healthy lifestyle and getting preventive care are important in promoting health and wellness. Follow your health care provider's instructions about healthy diet, exercising, and getting tested or screened for diseases. Follow your health care provider's  instructions on monitoring your cholesterol and blood pressure. This information is not intended to replace advice given to you by your health care provider. Make sure you discuss any questions you have with your healthcare provider. Document Revised: 01/14/2018 Document Reviewed: 01/14/2018 Elsevier Patient Education  2022 [employer]vier Inc.  

## 2020-09-01 NOTE — Progress Notes (Signed)
Subjective:     Stephanie Rush is a 45 y.o. female and is here for a comprehensive physical exam. The patient reports problems - she has had an ongoing cough since covid. More dry and irritating. She feels "fine". No fever, chills, SOB, headache, congestion . She does admit her reflux has been worse since covid infection as well. No melena or hematochezia.   Social History   Socioeconomic History   Marital status: Married    Spouse name: Not on file   Number of children: Not on file   Years of education: Not on file   Highest education level: Not on file  Occupational History   Occupation: RN  Tobacco Use   Smoking status: Former   Smokeless tobacco: Never  Substance and Sexual Activity   Alcohol use: Yes    Alcohol/week: 0.0 standard drinks    Comment: socially   Drug use: No   Sexual activity: Yes    Partners: Male  Other Topics Concern   Not on file  Social History Narrative   Not on file   Social Determinants of Health   Financial Resource Strain: Not on file  Food Insecurity: Not on file  Transportation Needs: Not on file  Physical Activity: Not on file  Stress: Not on file  Social Connections: Not on file  Intimate Partner Violence: Not on file   Health Maintenance  Topic Date Due   HIV Screening  Never done   INFLUENZA VACCINE  09/04/2020   COLONOSCOPY (Pts 45-29yrs Insurance coverage will need to be confirmed)  09/01/2021 (Originally 03/04/2020)   MAMMOGRAM  10/13/2020   PAP SMEAR-Modifier  01/20/2022   TETANUS/TDAP  06/17/2029   COVID-19 Vaccine  Completed   Hepatitis C Screening  Completed   Pneumococcal Vaccine 74-68 Years old  Aged Out   HPV VACCINES  Aged Out    The following portions of the patient's history were reviewed and updated as appropriate: allergies, current medications, past family history, past medical history, past social history, past surgical history, and problem list.  Review of Systems Pertinent items noted in HPI and remainder of  comprehensive ROS otherwise negative.   Objective:    BP (!) 95/53   Pulse 94   Temp 98.2 F (36.8 C)   Ht 5\' 8"  (1.727 m)   Wt 158 lb 3.2 oz (71.8 kg)   SpO2 100%   BMI 24.05 kg/m  General appearance: alert, cooperative, and appears stated age Head: Normocephalic, without obvious abnormality, atraumatic Eyes: conjunctivae/corneas clear. PERRL, EOM's intact. Fundi benign. Ears: normal TM's and external ear canals both ears Nose: Nares normal. Septum midline. Mucosa normal. No drainage or sinus tenderness. Throat: lips, mucosa, and tongue normal; teeth and gums normal Neck: no adenopathy, no carotid bruit, no JVD, supple, symmetrical, trachea midline, and thyroid not enlarged, symmetric, no tenderness/mass/nodules Back: symmetric, no curvature. ROM normal. No CVA tenderness. Lungs: clear to auscultation bilaterally Heart: regular rate and rhythm, S1, S2 normal, no murmur, click, rub or gallop Abdomen: soft, non-tender; bowel sounds normal; no masses,  no organomegaly Extremities: extremities normal, atraumatic, no cyanosis or edema Pulses: 2+ and symmetric Skin: Skin color, texture, turgor normal. No rashes or lesions Lymph nodes: Cervical, supraclavicular, and axillary nodes normal. Neurologic: Alert and oriented X 3, normal strength and tone. Normal symmetric reflexes. Normal coordination and gait   . Depression screen [facility] 2/9 09/01/2020 05/29/2020 08/23/2019 01/26/2018 01/20/2017  Decreased Interest 0 0 1 0 1  Down, Depressed, Hopeless 0 1 1  1 1  PHQ - 2 Score 0 1 2 1 2   Altered sleeping - 0 0 0 -  Tired, decreased energy - 1 1 0 -  Change in appetite - 0 1 0 -  Feeling bad or failure about yourself  - 0 1 0 -  Trouble concentrating - 0 0 0 -  Moving slowly or fidgety/restless - 0 0 0 -  Suicidal thoughts - 0 0 0 -  PHQ-9 Score - 2 5 1  -  Difficult doing work/chores - Not difficult at all Not difficult at all Not difficult at all -   .. GAD 7 : Generalized Anxiety Score  05/29/2020 08/23/2019 01/26/2018  Nervous, Anxious, on Edge 0 1 1  Control/stop worrying 0 0 0  Worry too much - different things 0 0 0  Trouble relaxing 0 1 0  Restless 0 1 0  Easily annoyed or irritable 0 0 0  Afraid - awful might happen 0 0 0  Total GAD 7 Score 0 3 1  Anxiety Difficulty Not difficult at all Not difficult at all Not difficult at all     Assessment:    Healthy female exam.    Plan:  7/21/202112/25/2019Maddison was seen today for annual exam.  Diagnoses and all orders for this visit:  Routine physical examination  GAD (generalized anxiety disorder)  Mild episode of recurrent major depressive disorder (HCC)  Colon cancer screening -     Ambulatory referral to Gastroenterology  Post-viral cough syndrome -     methylPREDNISolone sodium succinate (SOLU-MEDROL) 125 mg/2 mL injection 125 mg  History of obesity -     Semaglutide-Weight Management 2.4 MG/0.75ML SOAJ; Inject 2.4 mg into the skin once a week.  .. Discussed 150 minutes of exercise a week.  Encouraged vitamin D 1000 units and Calcium 1300mg  or 4 servings of dairy a day.  Fasting labs ordered.  PHQ/GAD look great.  Ordered colonoscopy.  Mammogram due in September. Pap UTD.  Covid vaccine UTD.   BP a little low. Asymptomatic. Make sure staying hydrated. Cut back metoprolol to 1/2 tablet and see if continues to control rate but helps bring BP back up.   Continue wegovy for weight loss and stability right now. Goal weight for 2 years then consider transitioning off.   Post viral covid cough. Solumedrol given today. Possible some reflux cough associated. No red flags symptoms. Consider pepcid twice a day or even prilosec OtC for next 2-4 weeks.    See After Visit Summary for Counseling Recommendations

## 2020-09-02 ENCOUNTER — Encounter (INDEPENDENT_AMBULATORY_CARE_PROVIDER_SITE_OTHER): Payer: Self-pay

## 2020-09-02 LAB — COMPLETE METABOLIC PANEL WITH GFR
AG Ratio: 2.3 (calc) (ref 1.0–2.5)
ALT: 13 U/L (ref 6–29)
AST: 11 U/L (ref 10–35)
Albumin: 4.5 g/dL (ref 3.6–5.1)
Alkaline phosphatase (APISO): 50 U/L (ref 31–125)
BUN: 9 mg/dL (ref 7–25)
CO2: 28 mmol/L (ref 20–32)
Calcium: 9.9 mg/dL (ref 8.6–10.2)
Chloride: 105 mmol/L (ref 98–110)
Creat: 0.89 mg/dL (ref 0.50–0.99)
Globulin: 2 g/dL (calc) (ref 1.9–3.7)
Glucose, Bld: 80 mg/dL (ref 65–99)
Potassium: 4.9 mmol/L (ref 3.5–5.3)
Sodium: 140 mmol/L (ref 135–146)
Total Bilirubin: 0.6 mg/dL (ref 0.2–1.2)
Total Protein: 6.5 g/dL (ref 6.1–8.1)
eGFR: 81 mL/min/{1.73_m2} (ref >=60)

## 2020-09-02 LAB — LIPID PANEL W/REFLEX DIRECT LDL
Cholesterol: 196 mg/dL (ref <200)
HDL: 50 mg/dL (ref >=50)
LDL Cholesterol (Calc): 132 mg/dL (calc) — ABNORMAL HIGH
Non-HDL Cholesterol (Calc): 146 mg/dL (calc) — ABNORMAL HIGH (ref <130)
Total CHOL/HDL Ratio: 3.9 (calc) (ref <5.0)
Triglycerides: 52 mg/dL (ref <150)

## 2020-09-02 LAB — HEMOGLOBIN A1C
Hgb A1c MFr Bld: 5 % of total Hgb (ref <5.7)
Mean Plasma Glucose: 97 mg/dL
eAG (mmol/L): 5.4 mmol/L

## 2020-09-04 DIAGNOSIS — Z8639 Personal history of other endocrine, nutritional and metabolic disease: Secondary | ICD-10-CM | POA: Insufficient documentation

## 2020-09-04 NOTE — Progress Notes (Signed)
Stephanie Rush,   HDL and TG look great.  LDL 132 and down from last check. Optimal is under 100 but goal for you under 130. Looks good.  A1C normal.  Kidney and liver look great.

## 2020-09-11 ENCOUNTER — Other Ambulatory Visit (HOSPITAL_COMMUNITY): Payer: Self-pay

## 2020-09-13 ENCOUNTER — Other Ambulatory Visit (HOSPITAL_COMMUNITY): Payer: Self-pay

## 2020-10-02 ENCOUNTER — Encounter: Payer: Self-pay | Admitting: Physician Assistant

## 2020-10-03 ENCOUNTER — Other Ambulatory Visit (HOSPITAL_COMMUNITY): Payer: Self-pay

## 2020-10-03 ENCOUNTER — Other Ambulatory Visit: Payer: Self-pay | Admitting: Physician Assistant

## 2020-10-03 DIAGNOSIS — M545 Low back pain, unspecified: Secondary | ICD-10-CM

## 2020-10-03 MED ORDER — PREDNISONE 50 MG PO TABS
ORAL_TABLET | ORAL | 0 refills | Status: DC
  Filled 2020-10-03: qty 5, 5d supply, fill #0

## 2020-10-03 MED ORDER — CYCLOBENZAPRINE HCL 10 MG PO TABS
10.0000 mg | ORAL_TABLET | Freq: Three times a day (TID) | ORAL | 0 refills | Status: DC | PRN
  Filled 2020-10-03: qty 21, 7d supply, fill #0

## 2020-10-03 NOTE — Progress Notes (Signed)
Lumbar DDD with flare. No red flag signs of symptoms. Prednisone and flexeril sent.

## 2020-10-26 ENCOUNTER — Other Ambulatory Visit (HOSPITAL_BASED_OUTPATIENT_CLINIC_OR_DEPARTMENT_OTHER): Payer: Self-pay | Admitting: Physician Assistant

## 2020-10-26 DIAGNOSIS — Z1231 Encounter for screening mammogram for malignant neoplasm of breast: Secondary | ICD-10-CM

## 2020-11-01 ENCOUNTER — Other Ambulatory Visit: Payer: Self-pay | Admitting: Physician Assistant

## 2020-11-01 ENCOUNTER — Ambulatory Visit (INDEPENDENT_AMBULATORY_CARE_PROVIDER_SITE_OTHER): Payer: No Typology Code available for payment source

## 2020-11-01 ENCOUNTER — Other Ambulatory Visit (HOSPITAL_COMMUNITY): Payer: Self-pay

## 2020-11-01 ENCOUNTER — Other Ambulatory Visit: Payer: Self-pay

## 2020-11-01 DIAGNOSIS — F330 Major depressive disorder, recurrent, mild: Secondary | ICD-10-CM

## 2020-11-01 DIAGNOSIS — Z1231 Encounter for screening mammogram for malignant neoplasm of breast: Secondary | ICD-10-CM

## 2020-11-01 DIAGNOSIS — F411 Generalized anxiety disorder: Secondary | ICD-10-CM

## 2020-11-02 ENCOUNTER — Other Ambulatory Visit (HOSPITAL_COMMUNITY): Payer: Self-pay

## 2020-11-02 MED ORDER — ESCITALOPRAM OXALATE 10 MG PO TABS
ORAL_TABLET | Freq: Every day | ORAL | 1 refills | Status: DC
  Filled 2020-11-02 – 2020-12-01 (×2): qty 90, 90d supply, fill #0

## 2020-11-04 ENCOUNTER — Other Ambulatory Visit (HOSPITAL_COMMUNITY): Payer: Self-pay

## 2020-11-06 ENCOUNTER — Encounter: Payer: Self-pay | Admitting: Physician Assistant

## 2020-11-06 DIAGNOSIS — F411 Generalized anxiety disorder: Secondary | ICD-10-CM

## 2020-11-06 DIAGNOSIS — F330 Major depressive disorder, recurrent, mild: Secondary | ICD-10-CM

## 2020-11-07 ENCOUNTER — Other Ambulatory Visit (HOSPITAL_COMMUNITY): Payer: Self-pay

## 2020-11-07 MED ORDER — METOPROLOL SUCCINATE ER 25 MG PO TB24
25.0000 mg | ORAL_TABLET | Freq: Every day | ORAL | 1 refills | Status: DC
  Filled 2020-11-07 – 2020-12-01 (×2): qty 90, 90d supply, fill #0
  Filled 2021-03-14: qty 90, 90d supply, fill #1

## 2020-11-07 MED ORDER — BUPROPION HCL ER (XL) 300 MG PO TB24
ORAL_TABLET | Freq: Every day | ORAL | 1 refills | Status: DC
Start: 2020-11-07 — End: 2021-05-23
  Filled 2020-11-07: qty 90, fill #0
  Filled 2020-12-26: qty 90, 90d supply, fill #0
  Filled 2021-04-01: qty 90, 90d supply, fill #1

## 2020-11-07 NOTE — Progress Notes (Signed)
Normal mammogram. Follow up in 1 year.

## 2020-11-16 ENCOUNTER — Other Ambulatory Visit (HOSPITAL_COMMUNITY): Payer: Self-pay

## 2020-11-27 ENCOUNTER — Encounter: Payer: Self-pay | Admitting: Physician Assistant

## 2020-11-28 ENCOUNTER — Ambulatory Visit: Payer: No Typology Code available for payment source | Admitting: Physician Assistant

## 2020-11-28 DIAGNOSIS — Z91199 Patient's noncompliance with other medical treatment and regimen due to unspecified reason: Secondary | ICD-10-CM

## 2020-11-28 NOTE — Progress Notes (Signed)
No show

## 2020-12-01 ENCOUNTER — Other Ambulatory Visit (HOSPITAL_COMMUNITY): Payer: Self-pay

## 2020-12-26 ENCOUNTER — Other Ambulatory Visit (HOSPITAL_COMMUNITY): Payer: Self-pay

## 2021-01-08 ENCOUNTER — Telehealth: Payer: Self-pay | Admitting: *Deleted

## 2021-01-08 NOTE — Telephone Encounter (Signed)
Returned call from 10:28 AM. Left patient a message to call and schedule annual, what office does she want to schedule with and continue care with.

## 2021-01-08 NOTE — Telephone Encounter (Signed)
Returned call from 1:43 PM. Left patient a message with appointment information for scheduled appointment on 01/16/2021 at 3:30 PM.

## 2021-01-10 ENCOUNTER — Encounter: Payer: Self-pay | Admitting: Physician Assistant

## 2021-01-16 ENCOUNTER — Ambulatory Visit: Payer: No Typology Code available for payment source | Admitting: Advanced Practice Midwife

## 2021-01-25 ENCOUNTER — Other Ambulatory Visit (HOSPITAL_COMMUNITY): Payer: Self-pay

## 2021-01-31 ENCOUNTER — Other Ambulatory Visit (HOSPITAL_COMMUNITY): Payer: Self-pay

## 2021-02-01 ENCOUNTER — Encounter: Payer: No Typology Code available for payment source | Admitting: Family Medicine

## 2021-02-02 ENCOUNTER — Encounter: Payer: Self-pay | Admitting: Physician Assistant

## 2021-02-02 ENCOUNTER — Other Ambulatory Visit (HOSPITAL_COMMUNITY): Payer: Self-pay

## 2021-02-06 ENCOUNTER — Other Ambulatory Visit (HOSPITAL_COMMUNITY): Payer: Self-pay

## 2021-02-07 ENCOUNTER — Other Ambulatory Visit (HOSPITAL_COMMUNITY): Payer: Self-pay

## 2021-02-07 NOTE — Telephone Encounter (Signed)
Medication: Semaglutide-Weight Management 2.4 MG/0.75ML SOAJ Prior authorization determination received Medication has been approved Approval dates: 02/06/2021-02/05/2022  Patient aware via: phone Pharmacy aware: Yes Provider aware via this encounter

## 2021-02-22 ENCOUNTER — Other Ambulatory Visit (HOSPITAL_COMMUNITY): Payer: Self-pay

## 2021-03-07 ENCOUNTER — Other Ambulatory Visit (HOSPITAL_COMMUNITY): Payer: Self-pay

## 2021-03-14 ENCOUNTER — Other Ambulatory Visit (HOSPITAL_COMMUNITY): Payer: Self-pay

## 2021-03-28 ENCOUNTER — Encounter: Payer: Self-pay | Admitting: Physician Assistant

## 2021-03-30 ENCOUNTER — Other Ambulatory Visit (HOSPITAL_COMMUNITY): Payer: Self-pay

## 2021-04-02 ENCOUNTER — Other Ambulatory Visit (HOSPITAL_COMMUNITY): Payer: Self-pay

## 2021-05-01 ENCOUNTER — Other Ambulatory Visit (HOSPITAL_COMMUNITY): Payer: Self-pay

## 2021-05-14 ENCOUNTER — Other Ambulatory Visit: Payer: Self-pay | Admitting: Neurology

## 2021-05-14 DIAGNOSIS — I10 Essential (primary) hypertension: Secondary | ICD-10-CM

## 2021-05-23 ENCOUNTER — Ambulatory Visit (INDEPENDENT_AMBULATORY_CARE_PROVIDER_SITE_OTHER): Payer: No Typology Code available for payment source | Admitting: Physician Assistant

## 2021-05-23 ENCOUNTER — Other Ambulatory Visit (HOSPITAL_COMMUNITY): Payer: Self-pay

## 2021-05-23 ENCOUNTER — Encounter: Payer: Self-pay | Admitting: Physician Assistant

## 2021-05-23 VITALS — BP 108/62 | HR 80 | Ht 69.0 in | Wt 152.0 lb

## 2021-05-23 DIAGNOSIS — I471 Supraventricular tachycardia: Secondary | ICD-10-CM

## 2021-05-23 DIAGNOSIS — R002 Palpitations: Secondary | ICD-10-CM | POA: Diagnosis not present

## 2021-05-23 DIAGNOSIS — Z9889 Other specified postprocedural states: Secondary | ICD-10-CM

## 2021-05-23 DIAGNOSIS — F330 Major depressive disorder, recurrent, mild: Secondary | ICD-10-CM | POA: Diagnosis not present

## 2021-05-23 DIAGNOSIS — F411 Generalized anxiety disorder: Secondary | ICD-10-CM

## 2021-05-23 DIAGNOSIS — R232 Flushing: Secondary | ICD-10-CM | POA: Diagnosis not present

## 2021-05-23 LAB — COMPLETE METABOLIC PANEL WITH GFR
AG Ratio: 2.4 (calc) (ref 1.0–2.5)
ALT: 18 U/L (ref 6–29)
AST: 13 U/L (ref 10–35)
Albumin: 4.6 g/dL (ref 3.6–5.1)
Alkaline phosphatase (APISO): 51 U/L (ref 31–125)
BUN: 16 mg/dL (ref 7–25)
CO2: 28 mmol/L (ref 20–32)
Calcium: 9.4 mg/dL (ref 8.6–10.2)
Chloride: 104 mmol/L (ref 98–110)
Creat: 0.85 mg/dL (ref 0.50–0.99)
Globulin: 1.9 g/dL (calc) (ref 1.9–3.7)
Glucose, Bld: 96 mg/dL (ref 65–99)
Potassium: 4.7 mmol/L (ref 3.5–5.3)
Sodium: 138 mmol/L (ref 135–146)
Total Bilirubin: 0.4 mg/dL (ref 0.2–1.2)
Total Protein: 6.5 g/dL (ref 6.1–8.1)
eGFR: 86 mL/min/{1.73_m2} (ref >=60)

## 2021-05-23 MED ORDER — BUPROPION HCL ER (XL) 300 MG PO TB24
ORAL_TABLET | Freq: Every day | ORAL | 1 refills | Status: DC
  Filled 2021-05-23: qty 90, fill #0
  Filled 2021-07-10: qty 90, 90d supply, fill #0
  Filled 2021-10-05: qty 90, 90d supply, fill #1

## 2021-05-23 MED ORDER — ESCITALOPRAM OXALATE 10 MG PO TABS
10.0000 mg | ORAL_TABLET | Freq: Every day | ORAL | 1 refills | Status: DC
  Filled 2021-05-23: qty 62, 62d supply, fill #0
  Filled 2021-05-23: qty 28, 28d supply, fill #0
  Filled 2021-06-06: qty 90, 90d supply, fill #0
  Filled 2021-09-11: qty 90, 90d supply, fill #1

## 2021-05-23 NOTE — Progress Notes (Signed)
Labs look great.

## 2021-05-23 NOTE — Patient Instructions (Signed)
Perimenopause Perimenopause is the normal time of a woman's life when the levels of estrogen, the female hormone produced by the ovaries, begin to decrease. This leads to changes in menstrual periods before they stop completely (menopause). Perimenopause can begin 2-8 years before menopause. During perimenopause, the ovaries may or may not produce an egg and a woman can still become pregnant. What are the causes? This condition is caused by a natural change in hormone levels that happens as you get older. What increases the risk? This condition is more likely to start at an earlier age if you have certain medical conditions or have undergone treatments, including: A tumor of the pituitary gland in the brain. A disease that affects the ovaries and hormone production. Certain cancer treatments, such as chemotherapy or hormone therapy, or radiation therapy on the pelvis. Heavy smoking and excessive alcohol use. Family history of early menopause. What are the signs or symptoms? Perimenopausal changes affect each woman differently. Symptoms of this condition may include: Hot flashes. Irregular menstrual periods. Night sweats. Changes in feelings about sex. This could be a decrease in sex drive or an increased discomfort around your sexuality. Vaginal dryness. Headaches. Mood swings. Depression. Problems sleeping (insomnia). Memory problems or trouble concentrating. Irritability. Tiredness. Weight gain. Anxiety. Trouble getting pregnant. How is this diagnosed? This condition is diagnosed based on your medical history, a physical exam, your age, your menstrual history, and your symptoms. Hormone tests may also be done. How is this treated? In some cases, no treatment is needed. You and your health care provider should make a decision together about whether treatment is necessary. Treatment will be based on your individual condition and preferences. Various treatments are available, such  as: Menopausal hormone therapy (MHT). Medicines to treat specific symptoms. Acupuncture. Vitamin or herbal supplements. Before starting treatment, make sure to let your health care provider know if you have a personal or family history of: Heart disease. Breast cancer. Blood clots. Diabetes. Osteoporosis. Follow these instructions at home: Medicines Take over-the-counter and prescription medicines only as told by your health care provider. Take vitamin supplements only as told by your health care provider. Talk with your health care provider before starting any herbal supplements. Lifestyle  Do not use any products that contain nicotine or tobacco, such as cigarettes, e-cigarettes, and chewing tobacco. If you need help quitting, ask your health care provider. Get at least 30 minutes of physical activity on 5 or more days each week. Eat a balanced diet that includes fresh fruits and vegetables, whole grains, soybeans, eggs, lean meat, and low-fat dairy. Avoid alcoholic and caffeinated beverages, as well as spicy foods. This may help prevent hot flashes. Get 7-8 hours of sleep each night. Dress in layers that can be removed to help you manage hot flashes. Find ways to manage stress, such as deep breathing, meditation, or journaling. General instructions  Keep track of your menstrual periods, including: When they occur. How heavy they are and how long they last. How much time passes between periods. Keep track of your symptoms, noting when they start, how often you have them, and how long they last. Use vaginal lubricants or moisturizers to help with vaginal dryness and improve comfort during sex. You can still become pregnant if you are having irregular periods. Make sure you use contraception during perimenopause if you do not want to get pregnant. Keep all follow-up visits. This is important. This includes any group therapy or counseling. Contact a health care provider if: You  have   heavy vaginal bleeding or pass blood clots. Your period lasts more than 2 days longer than normal. Your periods are recurring sooner than 21 days. You bleed after having sex. You have pain during sex. Get help right away if you have: Chest pain, trouble breathing, or trouble talking. Severe depression. Pain when you urinate. Severe headaches. Vision problems. Summary Perimenopause is the time when a woman's body begins to move into menopause. This may happen naturally or as a result of other health problems or medical treatments. Perimenopause can begin 2-8 years before menopause, and it can last for several years. Perimenopausal symptoms can be managed through medicines, lifestyle changes, and complementary therapies such as acupuncture. This information is not intended to replace advice given to you by your health care provider. Make sure you discuss any questions you have with your health care provider. Document Revised: 07/08/2019 Document Reviewed: 07/08/2019 Elsevier Patient Education  2023 [employer] Inc[employer]  

## 2021-05-23 NOTE — Progress Notes (Signed)
? ?Established Patient Office Visit ? ?Subjective   ?Patient ID: Stephanie Rush, female    DOB: 1975/05/03  Age: 46 y.o. MRN: [id number] ? ?Chief Complaint  ?Patient presents with  ? Anxiety  ? ? ?HPI ?Pt is a 46 yo female who presents to the clinic with worsening anxiety, focus issues, hot flashes, palpitations.  ? ?She admits she did stop lexapro about 2-3 months ago and anxiety has gotten worse and worse. Denies any depression. Stayed on wellbutrin. No SI/HC. She does not like feeling some "anxious" during meetings and at work. She is also having sudden hot flashes and wonders about menopause. She has mirena and not having periods. She is worried about increase in palpitations and hx of AVNRT and ablation. She is wanting referral to cardiologist.  ? ?Patient Active Problem List  ? Diagnosis Date Noted  ? Hot flashes 05/23/2021  ? Palpitations 05/23/2021  ? S/P atrioventricular nodal ablation 05/23/2021  ? History of obesity 09/04/2020  ? Hypotension 05/30/2020  ? Lumbar degenerative disc disease 05/15/2017  ? Hyperlipidemia 01/05/2016  ? GAD (generalized anxiety disorder) 01/06/2015  ? Elevated liver enzymes 01/06/2015  ? Essential hypertension, benign 09/25/2013  ? Depression 07/21/2013  ? Anxiety state, unspecified 07/21/2013  ? AV nodal re-entry tachycardia (HCC) 07/21/2013  ? Class 1 obesity due to excess calories without serious comorbidity with body mass index (BMI) of 31.0 to 31.9 in adult 07/21/2013  ? Abnormal weight gain 07/21/2013  ? ?Past Medical History:  ?Diagnosis Date  ? AV nodal re-entry tachycardia (HCC)   ? Hypertension   ? Vaginal Pap smear, abnormal   ? Hx Leep  ? ?Allergies  ?Allergen Reactions  ? Other   ?  Any medication that increases heart rate: pt has re-entry arrhythmia.  ? ? ? ?ROS ?See HPI.  ?  ?Objective:  ?  ? ?BP 108/62   Pulse 80   Ht 5\' 9"  (1.753 m)   Wt 152 lb (68.9 kg)   SpO2 100%   BMI 22.45 kg/m?  ?BP Readings from Last 3 Encounters:  ?05/23/21 108/62  ?09/01/20 (!)  95/53  ?05/29/20 (!) 106/46  ? ?  ? ?Physical Exam ?Vitals reviewed.  ?Constitutional:   ?   Appearance: Normal appearance.  ?HENT:  ?   Head: Normocephalic.  ?Neck:  ?   Vascular: No carotid bruit.  ?Cardiovascular:  ?   Rate and Rhythm: Normal rate and regular rhythm.  ?   Pulses: Normal pulses.  ?   Heart sounds: Normal heart sounds.  ?Pulmonary:  ?   Effort: Pulmonary effort is normal.  ?   Breath sounds: Normal breath sounds.  ?Musculoskeletal:  ?   Right lower leg: No edema.  ?   Left lower leg: No edema.  ?Neurological:  ?   General: No focal deficit present.  ?   Mental Status: She is alert.  ?Psychiatric:     ?   Mood and Affect: Mood normal.  ? ? ?.. ? ?  05/23/2021  ? 10:59 AM 09/01/2020  ?  3:30 PM 05/29/2020  ?  4:40 PM 08/23/2019  ?  8:34 AM 01/26/2018  ?  9:49 AM  ?Depression screen PHQ 2/9  ?Decreased Interest 0 0 0 1 0  ?Down, Depressed, Hopeless 0 0 1 1 1   ?PHQ - 2 Score 0 0 1 2 1   ?Altered sleeping 2  0 0 0  ?Tired, decreased energy 2  1 1  0  ?Change in appetite 0  0  1 0  ?Feeling bad or failure about yourself  0  0 1 0  ?Trouble concentrating 1  0 0 0  ?Moving slowly or fidgety/restless 0  0 0 0  ?Suicidal thoughts 0  0 0 0  ?PHQ-9 Score 5  2 5 1   ?Difficult doing work/chores Somewhat difficult  Not difficult at all Not difficult at all Not difficult at all  ? ?.. ? ?  05/23/2021  ? 11:00 AM 05/29/2020  ?  4:41 PM 08/23/2019  ?  8:35 AM 01/26/2018  ?  9:49 AM  ?GAD 7 : Generalized Anxiety Score  ?Nervous, Anxious, on Edge 1 0 1 1  ?Control/stop worrying 0 0 0 0  ?Worry too much - different things 2 0 0 0  ?Trouble relaxing 1 0 1 0  ?Restless 0 0 1 0  ?Easily annoyed or irritable 1 0 0 0  ?Afraid - awful might happen 0 0 0 0  ?Total GAD 7 Score 5 0 3 1  ?Anxiety Difficulty Somewhat difficult Not difficult at all Not difficult at all Not difficult at all  ? ? ? ? ?The 10-year ASCVD risk score (Arnett DK, et al., 2019) is: 0.9% ? ?  ?Assessment & Plan:  ?..Stephanie Rush was seen today for anxiety. ? ?Diagnoses  and all orders for this visit: ? ?GAD (generalized anxiety disorder) ?-     buPROPion (WELLBUTRIN XL) 300 MG 24 hr tablet; TAKE 1 TABLET BY MOUTH DAILY. ?-     escitalopram (LEXAPRO) 10 MG tablet; Take 1 tablet (10 mg total) by mouth daily. ? ?Mild episode of recurrent major depressive disorder (HCC) ?-     buPROPion (WELLBUTRIN XL) 300 MG 24 hr tablet; TAKE 1 TABLET BY MOUTH DAILY. ?-     escitalopram (LEXAPRO) 10 MG tablet; Take 1 tablet (10 mg total) by mouth daily. ? ?Hot flashes ? ?Palpitations ? ?AV nodal re-entry tachycardia (HCC) ? ?S/P atrioventricular nodal ablation ? ? ?PHQ and GAD numbers up from baseline ?Added lexapro back in to treat anxiety and depression ?Could be that anxiety is causing the focus/palpitations/hot flashes issues ?Will order to hormonal labs ?Pt will remain on metoprolol ?Will make referral for cardiology ?Vital signs good today ?Continue on wegovy consider decreasing down to 1.7mg  weekly ? ? ? ?Return in about 6 months (around 11/22/2021), or if symptoms worsen or fail to improve.  ? ? ?11/24/2021, PA-C ? ?

## 2021-05-31 ENCOUNTER — Other Ambulatory Visit (HOSPITAL_COMMUNITY): Payer: Self-pay

## 2021-06-01 ENCOUNTER — Other Ambulatory Visit (HOSPITAL_COMMUNITY): Payer: Self-pay

## 2021-06-06 ENCOUNTER — Other Ambulatory Visit (HOSPITAL_COMMUNITY): Payer: Self-pay

## 2021-07-06 ENCOUNTER — Other Ambulatory Visit (HOSPITAL_COMMUNITY): Payer: Self-pay

## 2021-07-10 ENCOUNTER — Other Ambulatory Visit (HOSPITAL_COMMUNITY): Payer: Self-pay

## 2021-07-10 ENCOUNTER — Other Ambulatory Visit: Payer: Self-pay | Admitting: Physician Assistant

## 2021-07-10 MED ORDER — METOPROLOL SUCCINATE ER 25 MG PO TB24
25.0000 mg | ORAL_TABLET | Freq: Every day | ORAL | 1 refills | Status: DC
  Filled 2021-07-10: qty 90, 90d supply, fill #0
  Filled 2021-10-05: qty 90, 90d supply, fill #1

## 2021-08-06 ENCOUNTER — Other Ambulatory Visit (HOSPITAL_COMMUNITY): Payer: Self-pay

## 2021-08-09 ENCOUNTER — Other Ambulatory Visit (HOSPITAL_COMMUNITY): Payer: Self-pay

## 2021-08-09 ENCOUNTER — Other Ambulatory Visit (HOSPITAL_BASED_OUTPATIENT_CLINIC_OR_DEPARTMENT_OTHER): Payer: Self-pay

## 2021-08-09 MED ORDER — METHYLPREDNISOLONE 4 MG PO TBPK
ORAL_TABLET | ORAL | 0 refills | Status: DC
  Filled 2021-08-09 (×2): qty 21, 6d supply, fill #0

## 2021-08-09 MED ORDER — CYCLOBENZAPRINE HCL 5 MG PO TABS
5.0000 mg | ORAL_TABLET | Freq: Three times a day (TID) | ORAL | 0 refills | Status: DC
  Filled 2021-08-09 (×2): qty 45, 15d supply, fill #0

## 2021-08-13 ENCOUNTER — Other Ambulatory Visit (HOSPITAL_BASED_OUTPATIENT_CLINIC_OR_DEPARTMENT_OTHER): Payer: Self-pay

## 2021-09-11 ENCOUNTER — Other Ambulatory Visit: Payer: Self-pay | Admitting: Physician Assistant

## 2021-09-11 ENCOUNTER — Other Ambulatory Visit (HOSPITAL_COMMUNITY): Payer: Self-pay

## 2021-09-11 DIAGNOSIS — Z8639 Personal history of other endocrine, nutritional and metabolic disease: Secondary | ICD-10-CM

## 2021-09-12 ENCOUNTER — Encounter: Payer: Self-pay | Admitting: Physician Assistant

## 2021-09-13 ENCOUNTER — Other Ambulatory Visit (HOSPITAL_COMMUNITY): Payer: Self-pay

## 2021-09-13 MED ORDER — WEGOVY 2.4 MG/0.75ML ~~LOC~~ SOAJ
2.4000 mg | SUBCUTANEOUS | 0 refills | Status: DC
  Filled 2021-09-13: qty 3, 28d supply, fill #0

## 2021-09-13 MED ORDER — WEGOVY 2.4 MG/0.75ML ~~LOC~~ SOAJ
2.4000 mg | SUBCUTANEOUS | 0 refills | Status: DC
  Filled 2021-09-17: qty 3, 28d supply, fill #0

## 2021-09-13 NOTE — Addendum Note (Signed)
Addended by: Chalmers Cater on: 09/13/2021 08:48 AM   Modules accepted: Orders

## 2021-09-17 ENCOUNTER — Other Ambulatory Visit (HOSPITAL_COMMUNITY): Payer: Self-pay

## 2021-10-05 ENCOUNTER — Other Ambulatory Visit (HOSPITAL_COMMUNITY): Payer: Self-pay

## 2021-10-05 ENCOUNTER — Other Ambulatory Visit: Payer: Self-pay | Admitting: Physician Assistant

## 2021-10-05 ENCOUNTER — Other Ambulatory Visit (HOSPITAL_BASED_OUTPATIENT_CLINIC_OR_DEPARTMENT_OTHER): Payer: Self-pay

## 2021-10-05 MED ORDER — CYCLOBENZAPRINE HCL 5 MG PO TABS
ORAL_TABLET | ORAL | 0 refills | Status: DC
  Filled 2021-10-05 (×2): qty 45, 15d supply, fill #0

## 2021-10-05 MED ORDER — WEGOVY 2.4 MG/0.75ML ~~LOC~~ SOAJ
2.4000 mg | SUBCUTANEOUS | 0 refills | Status: DC
  Filled 2021-10-05: qty 3, 28d supply, fill #0

## 2021-10-05 MED ORDER — METHYLPREDNISOLONE 4 MG PO TBPK
ORAL_TABLET | ORAL | 0 refills | Status: DC
  Filled 2021-10-05: qty 21, 6d supply, fill #0

## 2021-10-06 ENCOUNTER — Other Ambulatory Visit (HOSPITAL_COMMUNITY): Payer: Self-pay

## 2021-10-09 ENCOUNTER — Other Ambulatory Visit (HOSPITAL_COMMUNITY): Payer: Self-pay

## 2021-10-16 ENCOUNTER — Other Ambulatory Visit (HOSPITAL_COMMUNITY): Payer: Self-pay

## 2021-11-14 ENCOUNTER — Other Ambulatory Visit: Payer: Self-pay | Admitting: Physician Assistant

## 2021-11-14 ENCOUNTER — Other Ambulatory Visit (HOSPITAL_COMMUNITY): Payer: Self-pay

## 2021-11-15 ENCOUNTER — Other Ambulatory Visit (HOSPITAL_COMMUNITY): Payer: Self-pay

## 2021-11-15 ENCOUNTER — Encounter: Payer: Self-pay | Admitting: Physician Assistant

## 2021-11-15 DIAGNOSIS — R7309 Other abnormal glucose: Secondary | ICD-10-CM

## 2021-11-15 DIAGNOSIS — E785 Hyperlipidemia, unspecified: Secondary | ICD-10-CM

## 2021-11-15 DIAGNOSIS — Z0000 Encounter for general adult medical examination without abnormal findings: Secondary | ICD-10-CM

## 2021-11-15 MED ORDER — WEGOVY 2.4 MG/0.75ML ~~LOC~~ SOAJ
2.4000 mg | SUBCUTANEOUS | 0 refills | Status: DC
  Filled 2021-11-15: qty 3, 28d supply, fill #0

## 2021-11-15 NOTE — Telephone Encounter (Signed)
Orders Placed This Encounter  Procedures   Lipid Panel w/reflex Direct LDL   CBC w/Diff/Platelet   COMPLETE METABOLIC PANEL WITH GFR   HgB A1c

## 2021-11-19 ENCOUNTER — Other Ambulatory Visit (HOSPITAL_COMMUNITY): Payer: Self-pay

## 2021-11-27 ENCOUNTER — Other Ambulatory Visit (HOSPITAL_COMMUNITY)
Admission: RE | Admit: 2021-11-27 | Discharge: 2021-11-27 | Disposition: A | Discharge status: Home / Self Care | Payer: No Typology Code available for payment source | Source: Ambulatory Visit | Attending: Physician Assistant | Admitting: Physician Assistant

## 2021-11-27 ENCOUNTER — Encounter: Payer: Self-pay | Admitting: Physician Assistant

## 2021-11-27 ENCOUNTER — Ambulatory Visit (INDEPENDENT_AMBULATORY_CARE_PROVIDER_SITE_OTHER): Payer: No Typology Code available for payment source | Admitting: Physician Assistant

## 2021-11-27 ENCOUNTER — Other Ambulatory Visit (HOSPITAL_COMMUNITY): Payer: Self-pay

## 2021-11-27 VITALS — BP 117/78 | HR 78 | Ht 69.0 in | Wt 159.0 lb

## 2021-11-27 DIAGNOSIS — Z1211 Encounter for screening for malignant neoplasm of colon: Secondary | ICD-10-CM

## 2021-11-27 DIAGNOSIS — I4719 Other supraventricular tachycardia: Secondary | ICD-10-CM

## 2021-11-27 DIAGNOSIS — Z124 Encounter for screening for malignant neoplasm of cervix: Secondary | ICD-10-CM | POA: Diagnosis not present

## 2021-11-27 DIAGNOSIS — Z23 Encounter for immunization: Secondary | ICD-10-CM

## 2021-11-27 DIAGNOSIS — F411 Generalized anxiety disorder: Secondary | ICD-10-CM | POA: Diagnosis not present

## 2021-11-27 DIAGNOSIS — Z0000 Encounter for general adult medical examination without abnormal findings: Secondary | ICD-10-CM

## 2021-11-27 DIAGNOSIS — F330 Major depressive disorder, recurrent, mild: Secondary | ICD-10-CM | POA: Diagnosis not present

## 2021-11-27 DIAGNOSIS — Z8639 Personal history of other endocrine, nutritional and metabolic disease: Secondary | ICD-10-CM

## 2021-11-27 MED ORDER — BUPROPION HCL ER (XL) 300 MG PO TB24
300.0000 mg | ORAL_TABLET | Freq: Every day | ORAL | 1 refills | Status: DC
  Filled 2021-11-27: qty 90, fill #0
  Filled 2022-01-16: qty 90, 90d supply, fill #0
  Filled 2022-04-12: qty 90, 90d supply, fill #1

## 2021-11-27 MED ORDER — ESCITALOPRAM OXALATE 10 MG PO TABS
10.0000 mg | ORAL_TABLET | Freq: Every day | ORAL | 1 refills | Status: DC
  Filled 2021-11-27: qty 90, 90d supply, fill #0
  Filled 2022-03-21: qty 90, 90d supply, fill #1

## 2021-11-27 MED ORDER — METOPROLOL SUCCINATE ER 25 MG PO TB24
25.0000 mg | ORAL_TABLET | Freq: Every day | ORAL | 1 refills | Status: DC
  Filled 2021-11-27 – 2022-01-09 (×2): qty 90, 90d supply, fill #0
  Filled 2022-03-21: qty 90, 90d supply, fill #1

## 2021-11-27 MED ORDER — WEGOVY 2.4 MG/0.75ML ~~LOC~~ SOAJ
2.4000 mg | SUBCUTANEOUS | 1 refills | Status: DC
  Filled 2021-11-27: qty 9, 84d supply, fill #0
  Filled 2021-12-17: qty 3, 28d supply, fill #0
  Filled 2022-01-16: qty 3, 28d supply, fill #1
  Filled 2022-02-09 – 2022-03-19 (×2): qty 3, 28d supply, fill #2
  Filled 2022-04-05 – 2022-04-11 (×4): qty 3, 28d supply, fill #3
  Filled 2022-05-08: qty 3, 28d supply, fill #4

## 2021-11-27 NOTE — Patient Instructions (Signed)

## 2021-11-27 NOTE — Progress Notes (Signed)
Subjective:     Stephanie Rush is a 46 y.o. female and is here for a comprehensive physical exam. The patient reports no problems.  Social History   Socioeconomic History   Marital status: Married    Spouse name: Not on file   Number of children: Not on file   Years of education: Not on file   Highest education level: Not on file  Occupational History   Occupation: RN  Tobacco Use   Smoking status: Former   Smokeless tobacco: Never  Substance and Sexual Activity   Alcohol use: Yes    Alcohol/week: 0.0 standard drinks of alcohol    Comment: socially   Drug use: No   Sexual activity: Yes    Partners: Male  Other Topics Concern   Not on file  Social History Narrative   Not on file   Social Determinants of Health   Financial Resource Strain: Not on file  Food Insecurity: Not on file  Transportation Needs: Not on file  Physical Activity: Not on file  Stress: Not on file  Social Connections: Not on file  Intimate Partner Violence: Not on file   Health Maintenance  Topic Date Due   HIV Screening  Never done   COLONOSCOPY (Pts 45-34yrs Insurance coverage will need to be confirmed)  Never done   INFLUENZA VACCINE  09/04/2021   MAMMOGRAM  11/01/2021   PAP SMEAR-Modifier  01/20/2022   COVID-19 Vaccine (5 - Pfizer series) 12/13/2021 (Originally 12/31/2019)   TETANUS/TDAP  06/17/2029   Hepatitis C Screening  Completed   HPV VACCINES  Aged Out    The following portions of the patient's history were reviewed and updated as appropriate: allergies, current medications, past family history, past medical history, past social history, past surgical history, and problem list.  Review of Systems A comprehensive review of systems was negative.   Objective:  .Marland Kitchen    05/23/2021   10:59 AM 09/01/2020    3:30 PM 05/29/2020    4:40 PM 08/23/2019    8:34 AM 01/26/2018    9:49 AM  Depression screen PHQ 2/9  Decreased Interest 0 0 0 1 0  Down, Depressed, Hopeless 0 0 1 1 1   PHQ - 2  Score 0 0 1 2 1   Altered sleeping 2  0 0 0  Tired, decreased energy 2  1 1  0  Change in appetite 0  0 1 0  Feeling bad or failure about yourself  0  0 1 0  Trouble concentrating 1  0 0 0  Moving slowly or fidgety/restless 0  0 0 0  Suicidal thoughts 0  0 0 0  PHQ-9 Score 5  2 5 1   Difficult doing work/chores Somewhat difficult  Not difficult at all Not difficult at all Not difficult at all   .    05/23/2021   11:00 AM 05/29/2020    4:41 PM 08/23/2019    8:35 AM 01/26/2018    9:49 AM  GAD 7 : Generalized Anxiety Score  Nervous, Anxious, on Edge 1 0 1 1  Control/stop worrying 0 0 0 0  Worry too much - different things 2 0 0 0  Trouble relaxing 1 0 1 0  Restless 0 0 1 0  Easily annoyed or irritable 1 0 0 0  Afraid - awful might happen 0 0 0 0  Total GAD 7 Score 5 0 3 1  Anxiety Difficulty Somewhat difficult Not difficult at all Not difficult at all Not difficult  at all      BP 117/78   Pulse 78   Ht 5\' 9"  (1.753 m)   Wt 159 lb (72.1 kg)   SpO2 100%   BMI 23.48 kg/m  General appearance: alert, cooperative, and appears stated age Head: Normocephalic, without obvious abnormality, atraumatic Eyes: conjunctivae/corneas clear. PERRL, EOM's intact. Fundi benign. Ears: normal TM's and external ear canals both ears Nose: Nares normal. Septum midline. Mucosa normal. No drainage or sinus tenderness. Throat: lips, mucosa, and tongue normal; teeth and gums normal Neck: no adenopathy, no carotid bruit, no JVD, supple, symmetrical, trachea midline, and thyroid not enlarged, symmetric, no tenderness/mass/nodules Back: symmetric, no curvature. ROM normal. No CVA tenderness. Lungs: clear to auscultation bilaterally Heart: regular rate and rhythm, S1, S2 normal, no murmur, click, rub or gallop Abdomen: soft, non-tender; bowel sounds normal; no masses,  no organomegaly Pelvic: cervix normal in appearance, external genitalia normal, no adnexal masses or tenderness, no cervical motion  tenderness, uterus normal size, shape, and consistency, vagina normal without discharge, and IUD string in place.  Extremities: extremities normal, atraumatic, no cyanosis or edema Pulses: 2+ and symmetric Skin: Skin color, texture, turgor normal. No rashes or lesions Lymph nodes: Cervical, supraclavicular, and axillary nodes normal. Neurologic: Alert and oriented X 3, normal strength and tone. Normal symmetric reflexes. Normal coordination and gait    Assessment:    Healthy female exam.      Plan:    Marland KitchenMarland KitchenEisley was seen today for annual exam.  Diagnoses and all orders for this visit:  Routine physical examination  Routine Papanicolaou smear  GAD (generalized anxiety disorder) -     escitalopram (LEXAPRO) 10 MG tablet; Take 1 tablet (10 mg total) by mouth daily. -     buPROPion (WELLBUTRIN XL) 300 MG 24 hr tablet; TAKE 1 TABLET BY MOUTH DAILY.  Mild episode of recurrent major depressive disorder (HCC) -     escitalopram (LEXAPRO) 10 MG tablet; Take 1 tablet (10 mg total) by mouth daily. -     buPROPion (WELLBUTRIN XL) 300 MG 24 hr tablet; TAKE 1 TABLET BY MOUTH DAILY.  Colon cancer screening -     Ambulatory referral to Gastroenterology  History of obesity -     Semaglutide-Weight Management (WEGOVY) 2.4 MG/0.75ML SOAJ; Inject 2.4 mg into the skin once a week.  AV nodal re-entry tachycardia -     metoprolol succinate (TOPROL-XL) 25 MG 24 hr tablet; Take 1 tablet (25 mg total) by mouth daily.   .. Discussed 150 minutes of exercise a week.  Encouraged vitamin D 1000 units and Calcium 1300mg  or 4 servings of dairy a day.  Fasting labs ordered Phq/GAD no concerns Lexparo and wellbutrin refilled Pap done today IUD in place Mammogram scheduled Colonoscopy ordered Flu shot given today Continue on wegovy for weight management Follow up in 6 months   See After Visit Summary for Counseling Recommendations

## 2021-11-28 LAB — CYTOLOGY - PAP
Comment: NEGATIVE
Diagnosis: NEGATIVE
High risk HPV: NEGATIVE

## 2021-11-28 NOTE — Progress Notes (Signed)
No abnormal cells or HPV present. Follow up pap in 5 years.

## 2021-11-28 NOTE — Progress Notes (Signed)
Tilley,   Cholesterol has improved.  LDL 107.  WBC a little low-could be fighting off virus Globulin low-increase protein in diet. At least 60g a day.  A1c great

## 2021-11-28 NOTE — Telephone Encounter (Signed)
Labs added via [facility] portal Order number: [id number].

## 2021-11-30 LAB — COMPLETE METABOLIC PANEL WITH GFR
AG Ratio: 2.6 (calc) — ABNORMAL HIGH (ref 1.0–2.5)
ALT: 20 U/L (ref 6–29)
AST: 14 U/L (ref 10–35)
Albumin: 4.6 g/dL (ref 3.6–5.1)
Alkaline phosphatase (APISO): 52 U/L (ref 31–125)
BUN: 11 mg/dL (ref 7–25)
CO2: 28 mmol/L (ref 20–32)
Calcium: 9 mg/dL (ref 8.6–10.2)
Chloride: 107 mmol/L (ref 98–110)
Creat: 0.76 mg/dL (ref 0.50–0.99)
Globulin: 1.8 g/dL (calc) — ABNORMAL LOW (ref 1.9–3.7)
Glucose, Bld: 85 mg/dL (ref 65–99)
Potassium: 4.8 mmol/L (ref 3.5–5.3)
Sodium: 142 mmol/L (ref 135–146)
Total Bilirubin: 0.4 mg/dL (ref 0.2–1.2)
Total Protein: 6.4 g/dL (ref 6.1–8.1)
eGFR: 98 mL/min/{1.73_m2} (ref >=60)

## 2021-11-30 LAB — CBC WITH DIFFERENTIAL/PLATELET
Absolute Monocytes: 328 cells/uL (ref 200–950)
Basophils Absolute: 18 cells/uL (ref 0–200)
Basophils Relative: 0.5 %
Eosinophils Absolute: 18 cells/uL (ref 15–500)
Eosinophils Relative: 0.5 %
HCT: 37.8 % (ref 35.0–45.0)
Hemoglobin: 13.1 g/dL (ref 11.7–15.5)
Lymphs Abs: 1544 cells/uL (ref 850–3900)
MCH: 32 pg (ref 27.0–33.0)
MCHC: 34.7 g/dL (ref 32.0–36.0)
MCV: 92.2 fL (ref 80.0–100.0)
MPV: 10.4 fL (ref 7.5–12.5)
Monocytes Relative: 9.1 %
Neutro Abs: 1692 cells/uL (ref 1500–7800)
Neutrophils Relative %: 47 %
Platelets: 193 10*3/uL (ref 140–400)
RBC: 4.1 10*6/uL (ref 3.80–5.10)
RDW: 12.1 % (ref 11.0–15.0)
Total Lymphocyte: 42.9 %
WBC: 3.6 10*3/uL — ABNORMAL LOW (ref 3.8–10.8)

## 2021-11-30 LAB — FSH/LH
FSH: 44.5 m[IU]/mL
LH: 27.4 m[IU]/mL

## 2021-11-30 LAB — HEMOGLOBIN A1C
Hgb A1c MFr Bld: 4.9 % of total Hgb (ref <5.7)
Mean Plasma Glucose: 94 mg/dL
eAG (mmol/L): 5.2 mmol/L

## 2021-11-30 LAB — ESTRADIOL: Estradiol: 67 pg/mL

## 2021-11-30 LAB — LIPID PANEL W/REFLEX DIRECT LDL
Cholesterol: 190 mg/dL (ref <200)
HDL: 69 mg/dL (ref >=50)
LDL Cholesterol (Calc): 107 mg/dL (calc) — ABNORMAL HIGH
Non-HDL Cholesterol (Calc): 121 mg/dL (calc) (ref <130)
Total CHOL/HDL Ratio: 2.8 (calc) (ref <5.0)
Triglycerides: 49 mg/dL (ref <150)

## 2021-11-30 LAB — PROGESTERONE, LC/MS

## 2021-12-03 ENCOUNTER — Other Ambulatory Visit (HOSPITAL_BASED_OUTPATIENT_CLINIC_OR_DEPARTMENT_OTHER): Payer: Self-pay | Admitting: Physician Assistant

## 2021-12-03 DIAGNOSIS — Z1231 Encounter for screening mammogram for malignant neoplasm of breast: Secondary | ICD-10-CM

## 2021-12-04 NOTE — Progress Notes (Signed)
Your FSH is in post menopausal range.  You continue to have a fair amt of estrogen but peri-menopausal symptoms would be expected.

## 2021-12-06 ENCOUNTER — Ambulatory Visit (INDEPENDENT_AMBULATORY_CARE_PROVIDER_SITE_OTHER): Payer: No Typology Code available for payment source

## 2021-12-06 DIAGNOSIS — Z1231 Encounter for screening mammogram for malignant neoplasm of breast: Secondary | ICD-10-CM

## 2021-12-10 NOTE — Progress Notes (Signed)
Normal mammogram. Follow up in 1 year.

## 2021-12-17 ENCOUNTER — Other Ambulatory Visit (HOSPITAL_COMMUNITY): Payer: Self-pay

## 2022-01-09 ENCOUNTER — Other Ambulatory Visit (HOSPITAL_COMMUNITY): Payer: Self-pay

## 2022-01-16 ENCOUNTER — Other Ambulatory Visit (HOSPITAL_COMMUNITY): Payer: Self-pay

## 2022-01-17 ENCOUNTER — Other Ambulatory Visit: Payer: Self-pay

## 2022-01-17 ENCOUNTER — Other Ambulatory Visit (HOSPITAL_COMMUNITY): Payer: Self-pay

## 2022-02-22 ENCOUNTER — Other Ambulatory Visit (HOSPITAL_COMMUNITY): Payer: Self-pay

## 2022-02-28 ENCOUNTER — Other Ambulatory Visit (HOSPITAL_COMMUNITY): Payer: Self-pay

## 2022-03-05 ENCOUNTER — Other Ambulatory Visit (HOSPITAL_COMMUNITY): Payer: Self-pay

## 2022-03-08 ENCOUNTER — Encounter: Payer: Self-pay | Admitting: Gastroenterology

## 2022-03-11 ENCOUNTER — Encounter: Payer: Self-pay | Admitting: Physician Assistant

## 2022-03-19 ENCOUNTER — Other Ambulatory Visit: Payer: Self-pay

## 2022-03-19 ENCOUNTER — Other Ambulatory Visit (HOSPITAL_COMMUNITY): Payer: Self-pay

## 2022-03-21 ENCOUNTER — Other Ambulatory Visit (HOSPITAL_COMMUNITY): Payer: Self-pay

## 2022-04-05 ENCOUNTER — Other Ambulatory Visit (HOSPITAL_COMMUNITY): Payer: Self-pay

## 2022-04-10 ENCOUNTER — Other Ambulatory Visit (HOSPITAL_COMMUNITY): Payer: Self-pay

## 2022-04-11 ENCOUNTER — Other Ambulatory Visit: Payer: Self-pay

## 2022-04-15 ENCOUNTER — Other Ambulatory Visit (HOSPITAL_COMMUNITY): Payer: Self-pay

## 2022-04-18 ENCOUNTER — Other Ambulatory Visit (HOSPITAL_COMMUNITY): Payer: Self-pay

## 2022-04-18 ENCOUNTER — Ambulatory Visit (AMBULATORY_SURGERY_CENTER): Payer: 59 | Admitting: *Deleted

## 2022-04-18 ENCOUNTER — Telehealth: Payer: Self-pay | Admitting: *Deleted

## 2022-04-18 VITALS — Ht 69.0 in | Wt 158.0 lb

## 2022-04-18 DIAGNOSIS — Z1211 Encounter for screening for malignant neoplasm of colon: Secondary | ICD-10-CM

## 2022-04-18 MED ORDER — NA SULFATE-K SULFATE-MG SULF 17.5-3.13-1.6 GM/177ML PO SOLN
1.0000 | Freq: Once | ORAL | 0 refills | Status: AC
  Filled 2022-04-18: qty 354, 3d supply, fill #0
  Filled 2022-05-08: qty 354, 2d supply, fill #0

## 2022-04-18 NOTE — Telephone Encounter (Signed)
Attempt to reach pt for pre-visit appt. LM with call back #. 2nd attempt using work #. LM with call back #

## 2022-04-18 NOTE — Progress Notes (Signed)
No egg or soy allergy known to patient  No issues known to pt with past sedation with any surgeries or procedures Patient denies ever being told they had issues or difficulty with intubation  No FH of Malignant Hyperthermia Pt is not on diet pills Pt is not on  home 02  Pt is not on blood thinners  Pt denies issues with constipation  Pt is not on dialysis Pt denies any upcoming cardiac testing Pt encouraged to use to use Singlecare or Goodrx to reduce cost  Patient's chart reviewed by John Nulty CNRA prior to previsit and patient appropriate for the [facility].  Previsit completed and red dot placed by patient's name on their procedure day (on provider's schedule).  . Visit by phone Instructions reviewed with pt and pt states understanding. Instructed to review again prior to procedure. Pt states they will.  Instructions sent by mail and by my chart 

## 2022-04-19 ENCOUNTER — Encounter: Payer: Self-pay | Admitting: Gastroenterology

## 2022-04-25 ENCOUNTER — Encounter: Payer: Self-pay | Admitting: Physician Assistant

## 2022-04-26 ENCOUNTER — Other Ambulatory Visit: Payer: Self-pay

## 2022-04-26 ENCOUNTER — Other Ambulatory Visit (HOSPITAL_COMMUNITY): Payer: Self-pay

## 2022-04-26 DIAGNOSIS — F411 Generalized anxiety disorder: Secondary | ICD-10-CM

## 2022-04-26 DIAGNOSIS — I4719 Other supraventricular tachycardia: Secondary | ICD-10-CM

## 2022-04-26 DIAGNOSIS — F330 Major depressive disorder, recurrent, mild: Secondary | ICD-10-CM

## 2022-04-26 MED ORDER — ESCITALOPRAM OXALATE 10 MG PO TABS: 10.0000 mg | ORAL_TABLET | Freq: Every day | ORAL | 0 refills | Status: DC

## 2022-04-26 MED ORDER — BUPROPION HCL ER (XL) 300 MG PO TB24: 300.0000 mg | ORAL_TABLET | Freq: Every day | ORAL | 0 refills | Status: DC

## 2022-04-26 MED ORDER — METOPROLOL SUCCINATE ER 25 MG PO TB24: 25.0000 mg | ORAL_TABLET | Freq: Every day | ORAL | 0 refills | Status: DC

## 2022-05-08 ENCOUNTER — Other Ambulatory Visit (HOSPITAL_COMMUNITY): Payer: Self-pay

## 2022-05-09 ENCOUNTER — Ambulatory Visit (AMBULATORY_SURGERY_CENTER): Payer: 59 | Admitting: Gastroenterology

## 2022-05-09 ENCOUNTER — Encounter: Payer: Self-pay | Admitting: Gastroenterology

## 2022-05-09 VITALS — BP 111/74 | HR 74 | Temp 97.5°F | Resp 13 | Ht 69.0 in | Wt 158.0 lb

## 2022-05-09 DIAGNOSIS — I10 Essential (primary) hypertension: Secondary | ICD-10-CM | POA: Diagnosis not present

## 2022-05-09 DIAGNOSIS — Z1211 Encounter for screening for malignant neoplasm of colon: Secondary | ICD-10-CM

## 2022-05-09 DIAGNOSIS — F419 Anxiety disorder, unspecified: Secondary | ICD-10-CM | POA: Diagnosis not present

## 2022-05-09 DIAGNOSIS — D123 Benign neoplasm of transverse colon: Secondary | ICD-10-CM

## 2022-05-09 DIAGNOSIS — K635 Polyp of colon: Secondary | ICD-10-CM | POA: Diagnosis not present

## 2022-05-09 MED ORDER — SODIUM CHLORIDE 0.9 % IV SOLN: 500.0000 mL | INTRAVENOUS | Status: DC

## 2022-05-09 NOTE — Patient Instructions (Signed)
Resume previous diet and medications. Awaiting pathology results. Repeat Colonoscopy date to be determined based on pathology. Handout provided on Colon polyps.  YOU HAD AN ENDOSCOPIC PROCEDURE TODAY AT [facility]:   Refer to the procedure report that was given to you for any specific questions about what was found during the examination.  If the procedure report does not answer your questions, please call your gastroenterologist to clarify.  If you requested that your care partner not be given the details of your procedure findings, then the procedure report has been included in a sealed envelope for you to review at your convenience later.  YOU SHOULD EXPECT: Some feelings of bloating in the abdomen. Passage of more gas than usual.  Walking can help get rid of the air that was put into your GI tract during the procedure and reduce the bloating. If you had a lower endoscopy (such as a colonoscopy or flexible sigmoidoscopy) you may notice spotting of blood in your stool or on the toilet paper. If you underwent a bowel prep for your procedure, you may not have a normal bowel movement for a few days.  Please Note:  You might notice some irritation and congestion in your nose or some drainage.  This is from the oxygen used during your procedure.  There is no need for concern and it should clear up in a day or so.  SYMPTOMS TO REPORT IMMEDIATELY:  Following lower endoscopy (colonoscopy or flexible sigmoidoscopy):  Excessive amounts of blood in the stool  Significant tenderness or worsening of abdominal pains  Swelling of the abdomen that is new, acute  Fever of 100F or higher  For urgent or emergent issues, a gastroenterologist can be reached at any hour by calling [phone]. Do not use MyChart messaging for urgent concerns.    DIET:  We do recommend a small meal at first, but then you may proceed to your regular diet.  Drink plenty of fluids but you should avoid alcoholic  beverages for 24 hours.  ACTIVITY:  You should plan to take it easy for the rest of today and you should NOT DRIVE or use heavy machinery until tomorrow (because of the sedation medicines used during the test).    FOLLOW UP: Our staff will call the number listed on your records the next business day following your procedure.  We will call around 7:15- 8:00 am to check on you and address any questions or concerns that you may have regarding the information given to you following your procedure. If we do not reach you, we will leave a message.     If any biopsies were taken you will be contacted by phone or by letter within the next 1-3 weeks.  Please call us at [phone] if you have not heard about the biopsies in 3 weeks.    SIGNATURES/CONFIDENTIALITY: You and/or your care partner have signed paperwork which will be entered into your electronic medical record.  These signatures attest to the fact that that the information above on your After Visit Summary has been reviewed and is understood.  Full responsibility of the confidentiality of this discharge information lies with you and/or your care-partner.

## 2022-05-09 NOTE — Progress Notes (Signed)
Sedate, gd SR, tolerated procedure well, VSS, report to RN 

## 2022-05-09 NOTE — Op Note (Signed)
[facility] Patient Name: Stephanie Rush Procedure Date: 05/09/2022 9:22 AM MRN: [id number] Endoscopist: [place]. Candis Schatz , MD, [id number] Age: 47 Referring MD:  Date of Birth: 1975-12-01 Gender: Female Account #: [id number] Procedure:                Colonoscopy Indications:              Screening for colorectal malignant neoplasm, This                            is the patient's first colonoscopy Medicines:                Monitored Anesthesia Care Procedure:                Pre-Anesthesia Assessment:                           - Prior to the procedure, a History and Physical                            was performed, and patient medications and                            allergies were reviewed. The patient's tolerance of                            previous anesthesia was also reviewed. The risks                            and benefits of the procedure and the sedation                            options and risks were discussed with the patient.                            All questions were answered, and informed consent                            was obtained. Prior Anticoagulants: The patient has                            taken no anticoagulant or antiplatelet agents. ASA                            Grade Assessment: II - A patient with mild systemic                            disease. After reviewing the risks and benefits,                            the patient was deemed in satisfactory condition to                            undergo the procedure.  After obtaining informed consent, the colonoscope                            was passed under direct vision. Throughout the                            procedure, the patient's blood pressure, pulse, and                            oxygen saturations were monitored continuously. The                            CF [id number] was introduced through the anus                            and advanced to  the the terminal ileum, with                            identification of the appendiceal orifice and IC                            valve. The colonoscopy was performed without                            difficulty. The patient tolerated the procedure                            well. The quality of the bowel preparation was                            excellent. The terminal ileum, ileocecal valve,                            appendiceal orifice, and rectum were photographed.                            The bowel preparation used was SUPREP via split                            dose instruction. Scope In: 9:31:33 AM Scope Out: 9:49:58 AM Scope Withdrawal Time: 0 hours 9 minutes 18 seconds  Total Procedure Duration: 0 hours 18 minutes 25 seconds  Findings:                 The perianal and digital rectal examinations were                            normal. Pertinent negatives include normal                            sphincter tone and no palpable rectal lesions.                           A 4 mm polyp was found in the transverse colon. The  polyp was flat. The polyp was removed with a cold                            snare. Resection and retrieval were complete.                            Estimated blood loss was minimal.                           The exam was otherwise normal throughout the                            examined colon.                           The terminal ileum appeared normal.                           The retroflexed view of the distal rectum and anal                            verge was normal and showed no anal or rectal                            abnormalities. Complications:            No immediate complications. Estimated Blood Loss:     Estimated blood loss was minimal. Impression:               - One 4 mm polyp in the transverse colon, removed                            with a cold snare. Resected and retrieved.                           -  The examined portion of the ileum was normal.                           - The distal rectum and anal verge are normal on                            retroflexion view.                           - The GI Genius (intelligent endoscopy module),                            computer-aided polyp detection system powered by AI                            was utilized to detect colorectal polyps through                            enhanced visualization during colonoscopy. Recommendation:           - Patient has  a contact number available for                            emergencies. The signs and symptoms of potential                            delayed complications were discussed with the                            patient. Return to normal activities tomorrow.                            Written discharge instructions were provided to the                            patient.                           - Resume previous diet.                           - Continue present medications.                           - Await pathology results.                           - Repeat colonoscopy (date not yet determined) for                            surveillance based on pathology results. Grason Brailsford E. Candis Schatz, MD 05/09/2022 9:54:15 AM This report has been signed electronically.

## 2022-05-09 NOTE — Progress Notes (Signed)
[facility] History and Physical   Primary Care Physician:  Lavada Mesi   Reason for Procedure:   Colon cancer screening  Plan:    Screening colonoscopy     HPI: Stephanie Rush is a 47 y.o. female undergoing initial average risk screening colonoscopy.  She has no family history of colon cancer and no chronic GI symptoms other than chronic constipation.    Past Medical History:  Diagnosis Date   Anxiety    AV nodal re-entry tachycardia    Hypertension    Vaginal Pap smear, abnormal    Hx Leep    Past Surgical History:  Procedure Laterality Date   ABLATION  2002, 2009   cardiac   LAPAROSCOPIC APPENDECTOMY N/A 12/24/2014   Procedure: APPENDECTOMY LAPAROSCOPIC;  Surgeon: Greer Pickerel, MD;  Location: [facility];  Service: General;  Laterality: N/A;   LEEP      Prior to Admission medications   Medication Sig Start Date End Date Taking? Authorizing Provider  buPROPion (WELLBUTRIN XL) 300 MG 24 hr tablet Take 1 tablet (300 mg total) by mouth daily. 04/26/22  Yes Breeback, Jade L, PA-C  escitalopram (LEXAPRO) 10 MG tablet Take 1 tablet (10 mg total) by mouth daily. 04/26/22  Yes Breeback, Jade L, PA-C  levonorgestrel (MIRENA) 20 MCG/24HR IUD 1 each by Intrauterine route once.   Yes [provider]  metoprolol succinate (TOPROL-XL) 25 MG 24 hr tablet Take 1 tablet (25 mg total) by mouth daily. 04/26/22  Yes Breeback, Jade L, PA-C  Na Sulfate-K Sulfate-Mg Sulf 17.5-3.13-1.6 GM/177ML SOLN Take 1 kit by mouth once for 1 dose. 04/18/22 05/09/22 Yes Daryel November, MD  Semaglutide-Weight Management (WEGOVY) 2.4 MG/0.75ML SOAJ Inject 2.4 mg into the skin once a week. 11/27/21   Donella Stade, PA-C    Current Outpatient Medications  Medication Sig Dispense Refill   buPROPion (WELLBUTRIN XL) 300 MG 24 hr tablet Take 1 tablet (300 mg total) by mouth daily. 3 tablet 0   escitalopram (LEXAPRO) 10 MG tablet Take 1 tablet (10 mg total) by mouth daily. 3 tablet 0    levonorgestrel (MIRENA) 20 MCG/24HR IUD 1 each by Intrauterine route once.     metoprolol succinate (TOPROL-XL) 25 MG 24 hr tablet Take 1 tablet (25 mg total) by mouth daily. 3 tablet 0   Na Sulfate-K Sulfate-Mg Sulf 17.5-3.13-1.6 GM/177ML SOLN Take 1 kit by mouth once for 1 dose. 354 mL 0   Semaglutide-Weight Management (WEGOVY) 2.4 MG/0.75ML SOAJ Inject 2.4 mg into the skin once a week. 9 mL 1   Current Facility-Administered Medications  Medication Dose Route Frequency Provider Last Rate Last Admin   0.9 %  sodium chloride infusion  500 mL Intravenous Continuous Daryel November, MD        Allergies as of 05/09/2022 - Review Complete 05/09/2022  Allergen Reaction Noted   Other  12/24/2014    Family History  Problem Relation Age of Onset   Hypertension Father    Hyperlipidemia Father    Crohn's disease Brother    Hypertension Brother    Hyperlipidemia Brother    Heart attack Maternal Grandmother    Hyperlipidemia Maternal Grandmother    Multiple sclerosis Maternal Grandmother    Fibromyalgia Maternal Grandmother    Alcohol abuse Paternal Grandmother    Heart attack Paternal Grandfather    Hypertension Paternal Grandfather    Hyperlipidemia Paternal Grandfather    Stroke Paternal Grandfather    Colon polyps Neg Hx  Esophageal cancer Neg Hx    Rectal cancer Neg Hx    Stomach cancer Neg Hx    Colon cancer Neg Hx     Social History   Socioeconomic History   Marital status: Married    Spouse name: Not on file   Number of children: Not on file   Years of education: Not on file   Highest education level: Not on file  Occupational History   Occupation: RN  Tobacco Use   Smoking status: Former   Smokeless tobacco: Never  Substance and Sexual Activity   Alcohol use: Yes    Alcohol/week: 0.0 standard drinks of alcohol    Comment: socially   Drug use: No   Sexual activity: Yes    Partners: Male  Other Topics Concern   Not on file  Social History Narrative    Not on file   Social Determinants of Health   Financial Resource Strain: Not on file  Food Insecurity: Not on file  Transportation Needs: Not on file  Physical Activity: Not on file  Stress: Not on file  Social Connections: Not on file  Intimate Partner Violence: Not on file    Review of Systems:  All other review of systems negative except as mentioned in the HPI.  Physical Exam: Vital signs BP 103/64   Pulse 83   Temp (!) 97.5 F (36.4 C) (Skin)   Ht 5\' 9"  (1.753 m)   Wt 158 lb (71.7 kg)   SpO2 100%   BMI 23.33 kg/m   General:   Alert,  Well-developed, well-nourished, pleasant and cooperative in NAD Airway:  Mallampati 1 Lungs:  Clear throughout to auscultation.   Heart:  Regular rate and rhythm; no murmurs, clicks, rubs,  or gallops. Abdomen:  Soft, nontender and nondistended. Normal bowel sounds.   Neuro/Psych:  Normal mood and affect. A and O x 3   Stephanie Wojdyla E. Candis Schatz, MD [facility]

## 2022-05-09 NOTE — Progress Notes (Signed)
Pt. states no medical or surgical changes since previsit or office visit. 

## 2022-05-09 NOTE — Progress Notes (Signed)
Called to room to assist during endoscopic procedure.  Patient ID and intended procedure confirmed with present staff. Received instructions for my participation in the procedure from the performing physician.  

## 2022-05-10 ENCOUNTER — Telehealth: Payer: Self-pay

## 2022-05-10 NOTE — Telephone Encounter (Signed)
Left message on answering machine. 

## 2022-05-21 NOTE — Progress Notes (Signed)
Ms. Riede,  The polyp which I removed during your recent procedure was proven to be completely benign but is considered a "pre-cancerous" polyp that MAY have grown into cancer if it had not been removed.  Studies shows that at least 20% of women over age 47 and 30% of men over age 69 have pre-cancerous polyps.  Based on current nationally recognized surveillance guidelines, I recommend that you have a repeat colonoscopy in 7 years.   If you develop any new rectal bleeding, abdominal pain or significant bowel habit changes, please contact me before then.

## 2022-05-29 ENCOUNTER — Ambulatory Visit (INDEPENDENT_AMBULATORY_CARE_PROVIDER_SITE_OTHER): Payer: 59 | Admitting: Physician Assistant

## 2022-05-29 ENCOUNTER — Other Ambulatory Visit: Payer: Self-pay | Admitting: Physician Assistant

## 2022-05-29 ENCOUNTER — Encounter: Payer: Self-pay | Admitting: Physician Assistant

## 2022-05-29 ENCOUNTER — Other Ambulatory Visit (HOSPITAL_COMMUNITY): Payer: Self-pay

## 2022-05-29 VITALS — BP 117/80 | HR 81 | Ht 69.0 in | Wt 164.0 lb

## 2022-05-29 DIAGNOSIS — Z8639 Personal history of other endocrine, nutritional and metabolic disease: Secondary | ICD-10-CM

## 2022-05-29 DIAGNOSIS — I4719 Other supraventricular tachycardia: Secondary | ICD-10-CM

## 2022-05-29 DIAGNOSIS — F330 Major depressive disorder, recurrent, mild: Secondary | ICD-10-CM | POA: Diagnosis not present

## 2022-05-29 DIAGNOSIS — F411 Generalized anxiety disorder: Secondary | ICD-10-CM

## 2022-05-29 MED ORDER — BUPROPION HCL ER (XL) 300 MG PO TB24
300.0000 mg | ORAL_TABLET | Freq: Every day | ORAL | 3 refills | Status: DC
Start: 2022-05-29 — End: 2023-06-13
  Filled 2022-05-29 – 2022-07-24 (×4): qty 90, 90d supply, fill #0
  Filled 2022-10-17: qty 90, 90d supply, fill #1
  Filled 2023-01-31 – 2023-02-13 (×3): qty 90, 90d supply, fill #2
  Filled 2023-05-10: qty 90, 90d supply, fill #3

## 2022-05-29 MED ORDER — METFORMIN HCL 500 MG PO TABS
500.0000 mg | ORAL_TABLET | Freq: Two times a day (BID) | ORAL | 3 refills | Status: DC
Start: 2022-05-29 — End: 2023-06-18
  Filled 2022-05-29: qty 180, 90d supply, fill #0
  Filled 2022-07-04 – 2022-08-24 (×2): qty 180, 90d supply, fill #1
  Filled 2022-12-26: qty 180, 90d supply, fill #2
  Filled 2022-12-31 – 2023-03-22 (×2): qty 180, 90d supply, fill #3

## 2022-05-29 MED ORDER — ESCITALOPRAM OXALATE 10 MG PO TABS
10.0000 mg | ORAL_TABLET | Freq: Every day | ORAL | 3 refills | Status: DC
Start: 2022-05-29 — End: 2023-06-13
  Filled 2022-05-29 – 2022-07-04 (×4): qty 90, 90d supply, fill #0
  Filled 2022-10-02: qty 90, 90d supply, fill #1
  Filled 2022-12-31: qty 90, 90d supply, fill #2
  Filled 2023-04-06: qty 90, 90d supply, fill #3

## 2022-05-29 MED ORDER — SEMAGLUTIDE-WEIGHT MANAGEMENT 2.4 MG/0.75ML ~~LOC~~ SOAJ
2.4000 mg | SUBCUTANEOUS | 0 refills | Status: DC
  Filled 2022-05-29: qty 3, 28d supply, fill #0

## 2022-05-29 MED ORDER — METOPROLOL SUCCINATE ER 25 MG PO TB24
25.0000 mg | ORAL_TABLET | Freq: Every day | ORAL | 3 refills | Status: DC
Start: 2022-05-29 — End: 2023-06-13
  Filled 2022-05-29 – 2022-07-04 (×4): qty 90, 90d supply, fill #0
  Filled 2022-10-02: qty 90, 90d supply, fill #1
  Filled 2022-12-31: qty 90, 90d supply, fill #2
  Filled 2023-04-06: qty 90, 90d supply, fill #3

## 2022-05-29 MED ORDER — AMBULATORY NON FORMULARY MEDICATION
2 refills | Status: AC
Start: 2022-05-29 — End: ?

## 2022-05-29 NOTE — Progress Notes (Signed)
Established Patient Office Visit  Subjective   Patient ID: Stephanie Rush, female    DOB: 1975-10-15  Age: 47 y.o. MRN: [id number]  Chief Complaint  Patient presents with   Follow-up    HPI Pt is a 47 yo female with AVNRT, MDD, GAD who needs refills and to discuss weight. She is concerned because she is out of wegovy and insurance will not pay for it anymore. She has lost 85lbs and it has been life changing and she does not want to gain it all back. She is exercising. She is on wellbutrin for mood.   .. Active Ambulatory Problems    Diagnosis Date Noted   Depression 07/21/2013   Anxiety state, unspecified 07/21/2013   AV nodal re-entry tachycardia 07/21/2013   Class 1 obesity due to excess calories without serious comorbidity with body mass index (BMI) of 31.0 to 31.9 in adult 07/21/2013   Abnormal weight gain 07/21/2013   Essential hypertension, benign 09/25/2013   GAD (generalized anxiety disorder) 01/06/2015   Elevated liver enzymes 01/06/2015   Hyperlipidemia 01/05/2016   Lumbar degenerative disc disease 05/15/2017   Hypotension 05/30/2020   History of obesity 09/04/2020   Hot flashes 05/23/2021   Palpitations 05/23/2021   S/P atrioventricular nodal ablation 05/23/2021   Resolved Ambulatory Problems    Diagnosis Date Noted   Appendicitis 12/24/2014   Past Medical History:  Diagnosis Date   Anxiety    Hypertension    Vaginal Pap smear, abnormal      Review of Systems  All other systems reviewed and are negative.     Objective:     BP 117/80   Pulse 81   Ht  (1.753 m)   Wt 164 lb (74.4 kg)   SpO2 99%   BMI 24.22 kg/m  BP Readings from Last 3 Encounters:  05/29/22 117/80  05/09/22 111/74  11/27/21 117/78   Wt Readings from Last 3 Encounters:  05/29/22 164 lb (74.4 kg)  05/09/22 158 lb (71.7 kg)  04/18/22 158 lb (71.7 kg)      Physical Exam Constitutional:      Appearance: Normal appearance.  HENT:     Head: Normocephalic.   Cardiovascular:     Rate and Rhythm: Normal rate and regular rhythm.  Pulmonary:     Effort: Pulmonary effort is normal.  Neurological:     General: No focal deficit present.     Mental Status: She is alert and oriented to person, place, and time.  Psychiatric:        Mood and Affect: Mood normal.        Assessment & Plan:  Marland KitchenMarland KitchenKameko was seen today for follow-up.  Diagnoses and all orders for this visit:  History of obesity -     metFORMIN (GLUCOPHAGE) 500 MG tablet; Take 1 tablet (500 mg total) by mouth 2 (two) times daily with a meal. -     AMBULATORY NON FORMULARY MEDICATION; Semaglutide 2.5mg  with pyridoxine  per mL Inject 2.4mg  weekly.  GAD (generalized anxiety disorder) -     escitalopram (LEXAPRO) 10 MG tablet; Take 1 tablet (10 mg total) by mouth daily. -     buPROPion (WELLBUTRIN XL) 300 MG 24 hr tablet; Take 1 tablet (300 mg total) by mouth daily.  Mild episode of recurrent major depressive disorder -     escitalopram (LEXAPRO) 10 MG tablet; Take 1 tablet (10 mg total) by mouth daily. -     buPROPion (WELLBUTRIN XL) 300 MG 24 hr  tablet; Take 1 tablet (300 mg total) by mouth daily.  AV nodal re-entry tachycardia -     metoprolol succinate (TOPROL-XL) 25 MG 24 hr tablet; Take 1 tablet (25 mg total) by mouth daily.  Vitals look great Avoid any stimulant due to AVNRT Pt is healthy weight and wants to maintain Continue 150 minutes of exercise a week Sent metformin for insulin resistance  Wegovy compounded due to insurance not paying for wegovy pens Discussed not FDA approved, pt is aware and accepts risk Follow up in 6 months   Return in about 6 months (around 11/28/2022) for cPE.    Tandy Gaw, PA-C

## 2022-05-31 ENCOUNTER — Other Ambulatory Visit: Payer: Self-pay

## 2022-05-31 ENCOUNTER — Other Ambulatory Visit (HOSPITAL_COMMUNITY): Payer: Self-pay

## 2022-06-03 ENCOUNTER — Other Ambulatory Visit: Payer: Self-pay

## 2022-06-03 ENCOUNTER — Other Ambulatory Visit (HOSPITAL_COMMUNITY): Payer: Self-pay

## 2022-06-03 MED ORDER — AMOXICILLIN 500 MG PO CAPS
500.0000 mg | ORAL_CAPSULE | Freq: Three times a day (TID) | ORAL | 0 refills | Status: DC
  Filled 2022-06-03: qty 30, 10d supply, fill #0

## 2022-06-04 ENCOUNTER — Encounter: Payer: Self-pay | Admitting: Physician Assistant

## 2022-06-04 DIAGNOSIS — K1379 Other lesions of oral mucosa: Secondary | ICD-10-CM

## 2022-06-21 ENCOUNTER — Other Ambulatory Visit: Payer: Self-pay

## 2022-06-21 ENCOUNTER — Other Ambulatory Visit (HOSPITAL_COMMUNITY): Payer: Self-pay

## 2022-06-28 ENCOUNTER — Other Ambulatory Visit (HOSPITAL_COMMUNITY): Payer: Self-pay

## 2022-07-04 ENCOUNTER — Encounter (INDEPENDENT_AMBULATORY_CARE_PROVIDER_SITE_OTHER): Payer: Self-pay

## 2022-07-04 ENCOUNTER — Other Ambulatory Visit (HOSPITAL_COMMUNITY): Payer: Self-pay

## 2022-07-04 ENCOUNTER — Other Ambulatory Visit: Payer: Self-pay

## 2022-07-04 ENCOUNTER — Encounter: Payer: Self-pay | Admitting: Physician Assistant

## 2022-07-04 MED ORDER — SEMAGLUTIDE-WEIGHT MANAGEMENT 2.4 MG/0.75ML ~~LOC~~ SOAJ
2.4000 mg | SUBCUTANEOUS | 0 refills | Status: AC
  Filled 2022-07-04: qty 3, 28d supply, fill #0

## 2022-07-12 ENCOUNTER — Other Ambulatory Visit: Payer: Self-pay | Admitting: Oncology

## 2022-07-12 ENCOUNTER — Encounter (HOSPITAL_COMMUNITY): Payer: Self-pay | Admitting: Pharmacist

## 2022-07-12 ENCOUNTER — Other Ambulatory Visit (HOSPITAL_COMMUNITY): Payer: Self-pay

## 2022-07-12 DIAGNOSIS — Z006 Encounter for examination for normal comparison and control in clinical research program: Secondary | ICD-10-CM

## 2022-07-15 ENCOUNTER — Other Ambulatory Visit (HOSPITAL_COMMUNITY): Payer: Self-pay

## 2022-07-16 ENCOUNTER — Other Ambulatory Visit: Payer: Self-pay

## 2022-07-16 ENCOUNTER — Other Ambulatory Visit (HOSPITAL_COMMUNITY): Payer: Self-pay

## 2022-07-24 ENCOUNTER — Other Ambulatory Visit: Payer: Self-pay

## 2022-07-24 ENCOUNTER — Other Ambulatory Visit (HOSPITAL_COMMUNITY): Payer: Self-pay

## 2022-08-24 ENCOUNTER — Other Ambulatory Visit (HOSPITAL_COMMUNITY): Payer: Self-pay

## 2022-09-14 ENCOUNTER — Other Ambulatory Visit (HOSPITAL_COMMUNITY): Payer: Self-pay

## 2022-10-02 ENCOUNTER — Other Ambulatory Visit (HOSPITAL_COMMUNITY): Payer: Self-pay

## 2022-10-17 ENCOUNTER — Other Ambulatory Visit: Payer: Self-pay

## 2022-12-27 ENCOUNTER — Other Ambulatory Visit (HOSPITAL_COMMUNITY)
Admission: RE | Admit: 2022-12-27 | Discharge: 2022-12-27 | Disposition: A | Discharge status: Home / Self Care | Payer: 59 | Source: Ambulatory Visit | Attending: Oncology | Admitting: Oncology

## 2022-12-27 ENCOUNTER — Ambulatory Visit (INDEPENDENT_AMBULATORY_CARE_PROVIDER_SITE_OTHER): Payer: 59 | Admitting: Physician Assistant

## 2022-12-27 ENCOUNTER — Other Ambulatory Visit (HOSPITAL_COMMUNITY): Payer: Self-pay

## 2022-12-27 VITALS — BP 107/76 | HR 73 | Ht 69.0 in | Wt 195.0 lb

## 2022-12-27 DIAGNOSIS — E663 Overweight: Secondary | ICD-10-CM

## 2022-12-27 DIAGNOSIS — Z13220 Encounter for screening for lipoid disorders: Secondary | ICD-10-CM | POA: Diagnosis not present

## 2022-12-27 DIAGNOSIS — I10 Essential (primary) hypertension: Secondary | ICD-10-CM

## 2022-12-27 DIAGNOSIS — Z131 Encounter for screening for diabetes mellitus: Secondary | ICD-10-CM

## 2022-12-27 DIAGNOSIS — I4719 Other supraventricular tachycardia: Secondary | ICD-10-CM

## 2022-12-27 DIAGNOSIS — Z8639 Personal history of other endocrine, nutritional and metabolic disease: Secondary | ICD-10-CM

## 2022-12-27 DIAGNOSIS — E559 Vitamin D deficiency, unspecified: Secondary | ICD-10-CM | POA: Diagnosis not present

## 2022-12-27 DIAGNOSIS — Z0000 Encounter for general adult medical examination without abnormal findings: Secondary | ICD-10-CM

## 2022-12-27 DIAGNOSIS — Z006 Encounter for examination for normal comparison and control in clinical research program: Secondary | ICD-10-CM | POA: Insufficient documentation

## 2022-12-27 NOTE — Progress Notes (Signed)
Complete physical exam  Patient: Stephanie Rush   DOB: 08/29/1975   47 y.o. Female  MRN: [id number]  Subjective:    Chief Complaint  Patient presents with   Annual Exam    Stephanie Rush is a 46 y.o. female who presents today for a complete physical exam. She reports consuming a general diet. The patient does not participate in regular exercise at present. She generally feels well. She reports sleeping well. She does have additional problems to discuss today.   She is not happy with weight gain. She did great with wegovy and lost over 80lbs. She has gained 30lbs of it back. Her food noise is terrible. Contrave did nothing to help her weight or cravings.   Most recent fall risk assessment:    12/27/2022   11:56 AM  Fall Risk   Falls in the past year? 0  Number falls in past yr: 0  Injury with Fall? 0  Risk for fall due to : No Fall Risks  Follow up Falls evaluation completed     Most recent depression screenings:    12/27/2022   11:56 AM 11/27/2021    8:41 AM  PHQ 2/9 Scores  PHQ - 2 Score 0 0  PHQ- 9 Score 2 1    Vision:Within last year and Dental: No current dental problems and Receives regular dental care  Patient Active Problem List   Diagnosis Date Noted   Overweight 12/27/2022   Vitamin D insufficiency 12/27/2022   Hot flashes 05/23/2021   Palpitations 05/23/2021   S/P atrioventricular nodal ablation 05/23/2021   History of obesity 09/04/2020   Hypotension 05/30/2020   Lumbar degenerative disc disease 05/15/2017   Hyperlipidemia 01/05/2016   GAD (generalized anxiety disorder) 01/06/2015   Elevated liver enzymes 01/06/2015   Essential hypertension, benign 09/25/2013   Depression 07/21/2013   Anxiety state 07/21/2013   AV nodal re-entry tachycardia (HCC) 07/21/2013   Class 1 obesity due to excess calories without serious comorbidity with body mass index (BMI) of 31.0 to 31.9 in adult 07/21/2013   Abnormal weight gain 07/21/2013   Past Medical History:   Diagnosis Date   Anxiety    AV nodal re-entry tachycardia (HCC)    Hypertension    Vaginal Pap smear, abnormal    Hx Leep   Past Surgical History:  Procedure Laterality Date   ABLATION  2002, 2009   cardiac   LAPAROSCOPIC APPENDECTOMY N/A 12/24/2014   Procedure: APPENDECTOMY LAPAROSCOPIC;  Surgeon: Gaynelle Adu, MD;  Location: [facility];  Service: General;  Laterality: N/A;   LEEP     Family History  Problem Relation Age of Onset   Hypertension Father    Hyperlipidemia Father    Crohn's disease Brother    Hypertension Brother    Hyperlipidemia Brother    Heart attack Maternal Grandmother    Hyperlipidemia Maternal Grandmother    Multiple sclerosis Maternal Grandmother    Fibromyalgia Maternal Grandmother    Alcohol abuse Paternal Grandmother    Heart attack Paternal Grandfather    Hypertension Paternal Grandfather    Hyperlipidemia Paternal Grandfather    Stroke Paternal Grandfather    Colon polyps Neg Hx    Esophageal cancer Neg Hx    Rectal cancer Neg Hx    Stomach cancer Neg Hx    Colon cancer Neg Hx    Allergies  Allergen Reactions   Other     Any medication that increases heart rate: pt has re-entry arrhythmia.  Patient Care Team: Nolene Ebbs as PCP - General (Family Medicine)   Outpatient Medications Prior to Visit  Medication Sig   buPROPion (WELLBUTRIN XL) 300 MG 24 hr tablet Take 1 tablet (300 mg total) by mouth daily.   escitalopram (LEXAPRO) 10 MG tablet Take 1 tablet (10 mg) by mouth daily.   levonorgestrel (MIRENA) 20 MCG/24HR IUD 1 each by Intrauterine route once.   metFORMIN (GLUCOPHAGE) 500 MG tablet Take 1 tablet (500 mg total) by mouth 2 (two) times daily with a meal.   metoprolol succinate (TOPROL-XL) 25 MG 24 hr tablet Take 1 tablet (25 mg) by mouth daily.   AMBULATORY NON FORMULARY MEDICATION Semaglutide 2.5mg  with pyridoxine 10mg  per mL Inject 2.4mg  weekly. (Patient not taking: Reported on 12/27/2022)   [DISCONTINUED]  amoxicillin (AMOXIL) 500 MG capsule Take 1 capsule (500 mg total) by mouth 3 (three) times daily until gone with food   No facility-administered medications prior to visit.    Review of Systems  All other systems reviewed and are negative.         Objective:     BP 107/76   Pulse 73   Ht 5\' 9"  (1.753 m)   Wt 195 lb (88.5 kg)   SpO2 99%   BMI 28.80 kg/m  BP Readings from Last 3 Encounters:  12/27/22 107/76  05/29/22 117/80  05/09/22 111/74   Wt Readings from Last 3 Encounters:  12/27/22 195 lb (88.5 kg)  05/29/22 164 lb (74.4 kg)  05/09/22 158 lb (71.7 kg)      Physical Exam  BP 107/76   Pulse 73   Ht 5\' 9"  (1.753 m)   Wt 195 lb (88.5 kg)   SpO2 99%   BMI 28.80 kg/m   General Appearance:    Alert, cooperative, no distress, appears stated age  Head:    Normocephalic, without obvious abnormality, atraumatic  Eyes:    PERRL, conjunctiva/corneas clear, EOM's intact, fundi    benign, both eyes  Ears:    Normal TM's and external ear canals, both ears  Nose:   Nares normal, septum midline, mucosa normal, no drainage    or sinus tenderness  Throat:   Lips, mucosa, and tongue normal; teeth and gums normal  Neck:   Supple, symmetrical, trachea midline, no adenopathy;    thyroid:  no enlargement/tenderness/nodules; no carotid   bruit or JVD  Back:     Symmetric, no curvature, ROM normal, no CVA tenderness  Lungs:     Clear to auscultation bilaterally, respirations unlabored  Chest Wall:    No tenderness or deformity   Heart:    Regular rate and rhythm, S1 and S2 normal, no murmur, rub   or gallop     Abdomen:     Soft, non-tender, bowel sounds active all four quadrants,    no masses, no organomegaly        Extremities:   Extremities normal, atraumatic, no cyanosis or edema  Pulses:   2+ and symmetric all extremities  Skin:   Skin color, texture, turgor normal, no rashes or lesions  Lymph nodes:   Cervical, supraclavicular, and axillary nodes normal  Neurologic:    CNII-XII intact, normal strength, sensation and reflexes    throughout       Assessment & Plan:    Routine Health Maintenance and Physical Exam  Immunization History  Administered Date(s) Administered   DTaP 05/06/1975, 06/05/1975, 10/06/1975, 01/04/1978, 05/24/2010   IPV 05/06/1975, 07/06/1975, 10/05/1977, 05/24/2010   Influenza Split  11/29/2008   Influenza,inj,Quad PF,6+ Mos 11/27/2021   Influenza-Unspecified 11/04/2010, 11/04/2011, 10/16/2015, 10/21/2017, 11/04/2018, 11/27/2020   MMR 07/28/1975, 03/03/2013   PFIZER(Purple Top)SARS-COV-2 Vaccination 02/09/2019, 03/01/2019, 08/29/2019, 11/05/2019   Td 10/16/2003   Tdap 06/13/2009, 06/18/2019    Health Maintenance  Topic Date Due   MAMMOGRAM  12/07/2022   COVID-19 Vaccine (5 - 2023-24 season) 01/12/2023 (Originally 10/06/2022)   HIV Screening  12/27/2023 (Originally 03/04/1990)   Cervical Cancer Screening (HPV/Pap Cotest)  11/28/2026   Colonoscopy  05/08/2029   DTaP/Tdap/Td (9 - Td or Tdap) 06/17/2029   INFLUENZA VACCINE  Completed   Hepatitis C Screening  Completed   HPV VACCINES  Aged Out    Discussed health benefits of physical activity, and encouraged her to engage in regular exercise appropriate for her age and condition. Marland KitchenMarylene Land was seen today for annual exam.  Diagnoses and all orders for this visit:  Routine physical examination -     CBC w/Diff/Platelet -     CMP14+EGFR -     Lipid panel -     TSH -     HgB A1c -     VITAMIN D 25 Hydroxy (Vit-D Deficiency, Fractures)  Screening for diabetes mellitus -     CMP14+EGFR -     HgB A1c  Screening for lipid disorders -     Lipid panel  AV nodal re-entry tachycardia (HCC)  Essential hypertension, benign -     CMP14+EGFR  Vitamin D insufficiency -     VITAMIN D 25 Hydroxy (Vit-D Deficiency, Fractures)  History of obesity  Overweight   .Marland Kitchen Discussed 150 minutes of exercise a week.  Encouraged vitamin D 1000 units and Calcium 1300mg  or 4 servings of  dairy a day.  PHQ no concerns Fasting labs ordered Mammogram scheduled for December Pap UTD Colonoscopy UTD Flu shot given today, declined covid vaccine   .Marland KitchenDiscussed low carb diet with 1500 calories and 80g of protein.  Exercising at least 150 minutes a week.  [employer] could be a Chief Technology Officer.  Failed contrave Not candidate for phentermine Start compounded wegovy and insurance is supposed to approve in new year      Tandy Gaw, [place]

## 2022-12-27 NOTE — Patient Instructions (Signed)

## 2022-12-28 LAB — TSH: TSH: 2.06 u[IU]/mL (ref 0.450–4.500)

## 2022-12-28 LAB — CMP14+EGFR
ALT: 33 [IU]/L — ABNORMAL HIGH (ref 0–32)
AST: 19 [IU]/L (ref 0–40)
Albumin: 4.6 g/dL (ref 3.9–4.9)
Alkaline Phosphatase: 79 [IU]/L (ref 44–121)
BUN/Creatinine Ratio: 18 (ref 9–23)
BUN: 14 mg/dL (ref 6–24)
Bilirubin Total: 0.3 mg/dL (ref 0.0–1.2)
CO2: 23 mmol/L (ref 20–29)
Calcium: 8.8 mg/dL (ref 8.7–10.2)
Chloride: 102 mmol/L (ref 96–106)
Creatinine, Ser: 0.78 mg/dL (ref 0.57–1.00)
Globulin, Total: 1.9 g/dL (ref 1.5–4.5)
Glucose: 85 mg/dL (ref 70–99)
Potassium: 4.3 mmol/L (ref 3.5–5.2)
Sodium: 140 mmol/L (ref 134–144)
Total Protein: 6.5 g/dL (ref 6.0–8.5)
eGFR: 94 mL/min/{1.73_m2} (ref >59)

## 2022-12-28 LAB — CBC WITH DIFFERENTIAL/PLATELET
Basophils Absolute: 0 10*3/uL (ref 0.0–0.2)
Basos: 1 %
EOS (ABSOLUTE): 0.1 10*3/uL (ref 0.0–0.4)
Eos: 1 %
Hematocrit: 43.3 % (ref 34.0–46.6)
Hemoglobin: 14.4 g/dL (ref 11.1–15.9)
Immature Grans (Abs): 0 10*3/uL (ref 0.0–0.1)
Immature Granulocytes: 0 %
Lymphocytes Absolute: 1.6 10*3/uL (ref 0.7–3.1)
Lymphs: 36 %
MCH: 31 pg (ref 26.6–33.0)
MCHC: 33.3 g/dL (ref 31.5–35.7)
MCV: 93 fL (ref 79–97)
Monocytes Absolute: 0.4 10*3/uL (ref 0.1–0.9)
Monocytes: 9 %
Neutrophils Absolute: 2.3 10*3/uL (ref 1.4–7.0)
Neutrophils: 53 %
Platelets: 194 10*3/uL (ref 150–450)
RBC: 4.65 x10E6/uL (ref 3.77–5.28)
RDW: 12.4 % (ref 11.7–15.4)
WBC: 4.5 10*3/uL (ref 3.4–10.8)

## 2022-12-28 LAB — LIPID PANEL
Chol/HDL Ratio: 3.2 ratio (ref 0.0–4.4)
Cholesterol, Total: 256 mg/dL — ABNORMAL HIGH (ref 100–199)
HDL: 79 mg/dL (ref >39)
LDL Chol Calc (NIH): 162 mg/dL — ABNORMAL HIGH (ref 0–99)
Triglycerides: 90 mg/dL (ref 0–149)
VLDL Cholesterol Cal: 15 mg/dL (ref 5–40)

## 2022-12-28 LAB — VITAMIN D 25 HYDROXY (VIT D DEFICIENCY, FRACTURES): Vit D, 25-Hydroxy: 31.6 ng/mL (ref 30.0–100.0)

## 2022-12-28 LAB — HEMOGLOBIN A1C
Est. average glucose Bld gHb Est-mCnc: 114 mg/dL
Hgb A1c MFr Bld: 5.6 % (ref 4.8–5.6)

## 2022-12-30 NOTE — Progress Notes (Signed)
Media,   Vitamin D, normal range, but low normal. Add another 1000 units daily to what you are taking. Take with dairy for better absorption.   A1C is 5.6 very close to pre-diabetes range but in normal range.   Thyroid looks good.   HDL, good cholesterol, looks great.  LDL, bad cholesterol, is not to goal.   10 year cardiovascular risk is low. For now continue to work on diet and exercise. Consider low dose statin if no improvement in LDL.   Marland Kitchen.The 10-year ASCVD risk score (Arnett DK, et al., 2019) is: 0.8%   Values used to calculate the score:     Age: 47 years     Sex: Female     Is Non-Hispanic African American: No     Diabetic: No     Tobacco smoker: No     Systolic Blood Pressure: 107 mmHg     Is BP treated: Yes     HDL Cholesterol: 79 mg/dL     Total Cholesterol: 256 mg/dL

## 2022-12-31 ENCOUNTER — Other Ambulatory Visit: Payer: Self-pay | Admitting: Physician Assistant

## 2022-12-31 ENCOUNTER — Other Ambulatory Visit: Payer: Self-pay

## 2022-12-31 DIAGNOSIS — Z1231 Encounter for screening mammogram for malignant neoplasm of breast: Secondary | ICD-10-CM

## 2023-01-12 LAB — GENECONNECT MOLECULAR SCREEN: Genetic Analysis Overall Interpretation: NEGATIVE

## 2023-01-13 ENCOUNTER — Other Ambulatory Visit: Payer: Self-pay | Admitting: Physician Assistant

## 2023-01-13 DIAGNOSIS — Z1231 Encounter for screening mammogram for malignant neoplasm of breast: Secondary | ICD-10-CM

## 2023-01-15 ENCOUNTER — Ambulatory Visit: Payer: 59

## 2023-01-15 DIAGNOSIS — Z1231 Encounter for screening mammogram for malignant neoplasm of breast: Secondary | ICD-10-CM

## 2023-01-17 NOTE — Progress Notes (Signed)
Normal mammogram. Follow up in 1 year.

## 2023-01-22 DIAGNOSIS — Z1231 Encounter for screening mammogram for malignant neoplasm of breast: Secondary | ICD-10-CM

## 2023-01-31 ENCOUNTER — Other Ambulatory Visit: Payer: Self-pay

## 2023-02-04 ENCOUNTER — Other Ambulatory Visit: Payer: Self-pay

## 2023-02-07 ENCOUNTER — Encounter: Payer: Self-pay | Admitting: Physician Assistant

## 2023-02-08 ENCOUNTER — Other Ambulatory Visit (HOSPITAL_COMMUNITY): Payer: Self-pay

## 2023-02-10 ENCOUNTER — Other Ambulatory Visit (HOSPITAL_BASED_OUTPATIENT_CLINIC_OR_DEPARTMENT_OTHER): Payer: Self-pay

## 2023-02-10 ENCOUNTER — Other Ambulatory Visit: Payer: Self-pay

## 2023-02-10 ENCOUNTER — Other Ambulatory Visit (HOSPITAL_COMMUNITY): Payer: Self-pay

## 2023-02-10 MED ORDER — ZEPBOUND 5 MG/0.5ML ~~LOC~~ SOAJ
5.0000 mg | SUBCUTANEOUS | 0 refills | Status: AC
  Filled 2023-02-10 – 2023-08-08 (×8): qty 2, 28d supply, fill #0

## 2023-02-12 ENCOUNTER — Other Ambulatory Visit: Payer: Self-pay

## 2023-02-14 ENCOUNTER — Other Ambulatory Visit: Payer: Self-pay

## 2023-02-14 ENCOUNTER — Other Ambulatory Visit (HOSPITAL_COMMUNITY): Payer: Self-pay

## 2023-02-28 ENCOUNTER — Other Ambulatory Visit: Payer: Self-pay

## 2023-03-22 ENCOUNTER — Other Ambulatory Visit (HOSPITAL_COMMUNITY): Payer: Self-pay

## 2023-03-26 ENCOUNTER — Other Ambulatory Visit: Payer: Self-pay

## 2023-04-07 ENCOUNTER — Other Ambulatory Visit (HOSPITAL_COMMUNITY): Payer: Self-pay

## 2023-05-10 ENCOUNTER — Other Ambulatory Visit (HOSPITAL_COMMUNITY): Payer: Self-pay

## 2023-06-13 ENCOUNTER — Other Ambulatory Visit: Payer: Self-pay | Admitting: Physician Assistant

## 2023-06-13 DIAGNOSIS — F411 Generalized anxiety disorder: Secondary | ICD-10-CM

## 2023-06-13 DIAGNOSIS — F330 Major depressive disorder, recurrent, mild: Secondary | ICD-10-CM

## 2023-06-13 DIAGNOSIS — I4719 Other supraventricular tachycardia: Secondary | ICD-10-CM

## 2023-06-17 ENCOUNTER — Other Ambulatory Visit (HOSPITAL_COMMUNITY): Payer: Self-pay

## 2023-06-17 MED ORDER — BUPROPION HCL ER (XL) 300 MG PO TB24
300.0000 mg | ORAL_TABLET | Freq: Every day | ORAL | 3 refills | Status: AC
  Filled 2023-06-17 – 2023-08-08 (×5): qty 90, 90d supply, fill #0
  Filled 2023-10-22 – 2023-10-23 (×2): qty 90, 90d supply, fill #1
  Filled 2023-10-23: qty 5, 5d supply, fill #1
  Filled 2023-11-06: qty 90, 90d supply, fill #2
  Filled 2024-02-08: qty 90, 90d supply, fill #3

## 2023-06-17 MED ORDER — METOPROLOL SUCCINATE ER 25 MG PO TB24
25.0000 mg | ORAL_TABLET | Freq: Every day | ORAL | 3 refills | Status: AC
  Filled 2023-06-17 – 2023-07-11 (×4): qty 90, 90d supply, fill #0
  Filled 2023-10-08: qty 90, 90d supply, fill #1
  Filled 2023-10-22 – 2024-01-13 (×2): qty 90, 90d supply, fill #2
  Filled 2024-02-08: qty 90, 90d supply, fill #3

## 2023-06-17 MED ORDER — ESCITALOPRAM OXALATE 10 MG PO TABS
10.0000 mg | ORAL_TABLET | Freq: Every day | ORAL | 3 refills | Status: AC
  Filled 2023-06-17 – 2023-07-11 (×4): qty 90, 90d supply, fill #0
  Filled 2023-10-08: qty 90, 90d supply, fill #1
  Filled 2023-10-22 – 2024-01-13 (×2): qty 90, 90d supply, fill #2
  Filled 2024-02-08: qty 90, 90d supply, fill #3

## 2023-06-18 ENCOUNTER — Other Ambulatory Visit: Payer: Self-pay | Admitting: Physician Assistant

## 2023-06-18 ENCOUNTER — Other Ambulatory Visit (HOSPITAL_COMMUNITY): Payer: Self-pay

## 2023-06-18 ENCOUNTER — Other Ambulatory Visit: Payer: Self-pay

## 2023-06-18 DIAGNOSIS — Z8639 Personal history of other endocrine, nutritional and metabolic disease: Secondary | ICD-10-CM

## 2023-06-18 MED ORDER — METFORMIN HCL 500 MG PO TABS
500.0000 mg | ORAL_TABLET | Freq: Two times a day (BID) | ORAL | 3 refills | Status: AC
  Filled 2023-06-18 – 2023-07-11 (×3): qty 180, 90d supply, fill #0
  Filled 2023-10-08: qty 180, 90d supply, fill #1
  Filled 2023-10-22 – 2024-01-13 (×2): qty 180, 90d supply, fill #2
  Filled 2024-02-08: qty 180, 90d supply, fill #3

## 2023-06-23 ENCOUNTER — Other Ambulatory Visit (HOSPITAL_COMMUNITY): Payer: Self-pay

## 2023-06-25 ENCOUNTER — Other Ambulatory Visit (HOSPITAL_COMMUNITY): Payer: Self-pay

## 2023-07-08 ENCOUNTER — Other Ambulatory Visit (HOSPITAL_COMMUNITY): Payer: Self-pay

## 2023-07-11 ENCOUNTER — Encounter (HOSPITAL_COMMUNITY): Payer: Self-pay

## 2023-07-12 ENCOUNTER — Other Ambulatory Visit (HOSPITAL_COMMUNITY): Payer: Self-pay

## 2023-07-14 ENCOUNTER — Other Ambulatory Visit: Payer: Self-pay

## 2023-07-14 ENCOUNTER — Other Ambulatory Visit (HOSPITAL_COMMUNITY): Payer: Self-pay

## 2023-07-16 ENCOUNTER — Other Ambulatory Visit: Payer: Self-pay

## 2023-07-24 ENCOUNTER — Other Ambulatory Visit: Payer: Self-pay

## 2023-08-08 ENCOUNTER — Encounter (HOSPITAL_COMMUNITY): Payer: Self-pay

## 2023-08-09 ENCOUNTER — Other Ambulatory Visit (HOSPITAL_COMMUNITY): Payer: Self-pay

## 2023-08-11 ENCOUNTER — Other Ambulatory Visit (HOSPITAL_COMMUNITY): Payer: Self-pay

## 2023-08-12 ENCOUNTER — Other Ambulatory Visit: Payer: Self-pay

## 2023-10-08 ENCOUNTER — Other Ambulatory Visit (HOSPITAL_COMMUNITY): Payer: Self-pay

## 2023-10-23 ENCOUNTER — Other Ambulatory Visit (HOSPITAL_COMMUNITY): Payer: Self-pay

## 2023-11-07 ENCOUNTER — Other Ambulatory Visit: Payer: Self-pay

## 2023-12-22 ENCOUNTER — Other Ambulatory Visit: Payer: Self-pay | Admitting: Physician Assistant

## 2023-12-22 DIAGNOSIS — Z1231 Encounter for screening mammogram for malignant neoplasm of breast: Secondary | ICD-10-CM

## 2024-01-13 ENCOUNTER — Other Ambulatory Visit (HOSPITAL_COMMUNITY): Payer: Self-pay

## 2024-02-09 ENCOUNTER — Other Ambulatory Visit: Payer: Self-pay

## 2024-02-09 ENCOUNTER — Other Ambulatory Visit (HOSPITAL_COMMUNITY): Payer: Self-pay
